# Patient Record
Sex: Male | Born: 1980 | Race: Black or African American | Hispanic: No | Marital: Married | Smoking: Never smoker
Health system: Southern US, Community
[De-identification: ages and names within clinical notes are randomized; demographics above are authoritative.]

## PROBLEM LIST (undated history)

## (undated) DIAGNOSIS — R002 Palpitations: Secondary | ICD-10-CM

## (undated) DIAGNOSIS — K219 Gastro-esophageal reflux disease without esophagitis: Secondary | ICD-10-CM

## (undated) DIAGNOSIS — I10 Essential (primary) hypertension: Secondary | ICD-10-CM

## (undated) DIAGNOSIS — J45909 Unspecified asthma, uncomplicated: Secondary | ICD-10-CM

## (undated) DIAGNOSIS — E7800 Pure hypercholesterolemia, unspecified: Secondary | ICD-10-CM

## (undated) DIAGNOSIS — J302 Other seasonal allergic rhinitis: Secondary | ICD-10-CM

## (undated) HISTORY — DX: Pure hypercholesterolemia, unspecified: E78.00

## (undated) HISTORY — DX: Gastro-esophageal reflux disease without esophagitis: K21.9

## (undated) HISTORY — PX: FOOT SURGERY: SHX648

## (undated) HISTORY — DX: Palpitations: R00.2

## (undated) HISTORY — DX: Unspecified asthma, uncomplicated: J45.909

## (undated) HISTORY — DX: Other seasonal allergic rhinitis: J30.2

---

## 2008-04-19 ENCOUNTER — Emergency Department (HOSPITAL_COMMUNITY): Admission: EM | Admit: 2008-04-19 | Discharge: 2008-04-19 | Payer: Self-pay | Admitting: Emergency Medicine

## 2008-04-21 ENCOUNTER — Emergency Department (HOSPITAL_COMMUNITY): Admission: EM | Admit: 2008-04-21 | Discharge: 2008-04-21 | Payer: Self-pay | Admitting: Emergency Medicine

## 2008-04-24 ENCOUNTER — Emergency Department (HOSPITAL_COMMUNITY): Admission: EM | Admit: 2008-04-24 | Discharge: 2008-04-25 | Payer: Self-pay | Admitting: Emergency Medicine

## 2010-12-21 ENCOUNTER — Emergency Department (HOSPITAL_COMMUNITY)
Admission: EM | Admit: 2010-12-21 | Discharge: 2010-12-21 | Disposition: A | Discharge status: Home / Self Care | Payer: Self-pay | Attending: Emergency Medicine | Admitting: Emergency Medicine

## 2010-12-21 ENCOUNTER — Emergency Department (HOSPITAL_COMMUNITY): Payer: Self-pay

## 2010-12-21 DIAGNOSIS — I10 Essential (primary) hypertension: Secondary | ICD-10-CM | POA: Insufficient documentation

## 2010-12-21 DIAGNOSIS — M940 Chondrocostal junction syndrome [Tietze]: Secondary | ICD-10-CM | POA: Insufficient documentation

## 2010-12-21 DIAGNOSIS — R071 Chest pain on breathing: Secondary | ICD-10-CM | POA: Insufficient documentation

## 2010-12-21 LAB — POCT I-STAT, CHEM 8
Calcium, Ion: 1.17 mmol/L (ref 1.12–1.32)
Creatinine, Ser: 1.3 mg/dL (ref 0.4–1.5)
Glucose, Bld: 74 mg/dL (ref 70–99)
HCT: 45 % (ref 39.0–52.0)
Hemoglobin: 15.3 g/dL (ref 13.0–17.0)

## 2010-12-21 LAB — POCT CARDIAC MARKERS: CKMB, poc: <1 ng/mL — ABNORMAL LOW (ref 1.0–8.0)

## 2010-12-21 IMAGING — CR DG CHEST 1V
1 series · 1 of 1 positions shown · non-contrast
Comparison: None

CLINICAL DATA: Chest pain.

CHEST - 1 VIEW

[w chest pa]
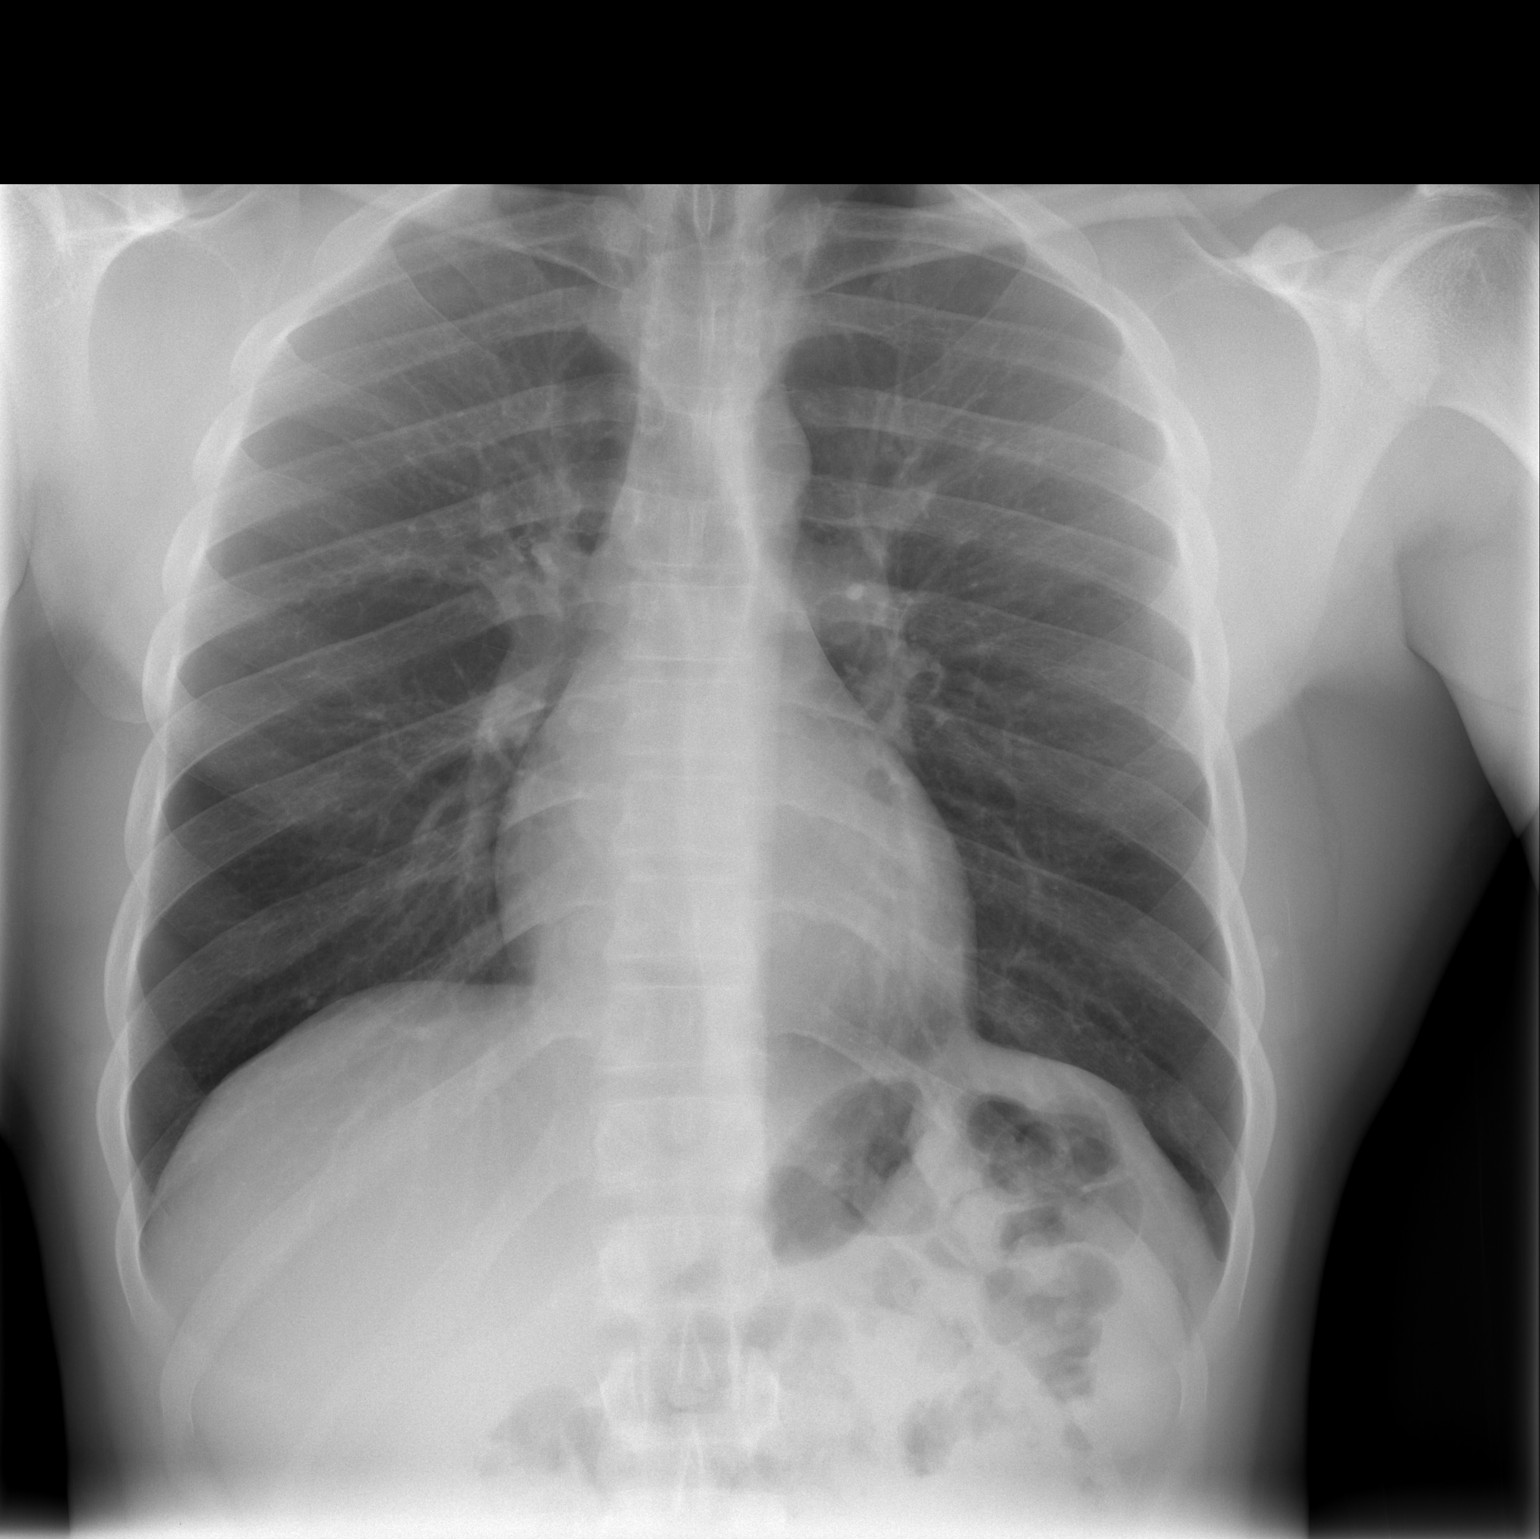

[1 of 1 positions shown; findings below may reference images not displayed]

FINDINGS: Heart and mediastinal contours are within normal limits.
No focal opacities or effusions.  No acute bony abnormality.
IMPRESSION: No active disease.

## 2011-06-27 LAB — WOUND CULTURE

## 2011-08-14 ENCOUNTER — Emergency Department (HOSPITAL_COMMUNITY)
Admission: EM | Admit: 2011-08-14 | Discharge: 2011-08-15 | Disposition: A | Discharge status: Home / Self Care | Payer: Self-pay | Attending: Emergency Medicine | Admitting: Emergency Medicine

## 2011-08-14 ENCOUNTER — Encounter: Payer: Self-pay | Admitting: *Deleted

## 2011-08-14 DIAGNOSIS — I10 Essential (primary) hypertension: Secondary | ICD-10-CM | POA: Insufficient documentation

## 2011-08-14 DIAGNOSIS — H539 Unspecified visual disturbance: Secondary | ICD-10-CM | POA: Insufficient documentation

## 2011-08-14 DIAGNOSIS — R51 Headache: Secondary | ICD-10-CM | POA: Insufficient documentation

## 2011-08-14 DIAGNOSIS — R079 Chest pain, unspecified: Secondary | ICD-10-CM | POA: Insufficient documentation

## 2011-08-14 HISTORY — DX: Essential (primary) hypertension: I10

## 2011-08-14 NOTE — ED Notes (Signed)
Pt reports he lost his job and has not been taking his blood pressure med (Norvasc 5mg ) since last March. States he had blurry vision and a h/a, took his Bp this evening and found it to be 177/93. Pt came to the ER for further evaluation.

## 2011-08-14 NOTE — ED Notes (Signed)
Pt states his bp has been high and he has a headache. Pt also c/o blurred vision.

## 2011-08-15 ENCOUNTER — Other Ambulatory Visit: Payer: Self-pay

## 2011-08-15 LAB — POCT I-STAT, CHEM 8
Creatinine, Ser: 1.2 mg/dL (ref 0.50–1.35)
Glucose, Bld: 88 mg/dL (ref 70–99)
HCT: 49 % (ref 39.0–52.0)
Hemoglobin: 16.7 g/dL (ref 13.0–17.0)
Potassium: 4.2 mEq/L (ref 3.5–5.1)
Sodium: 141 mEq/L (ref 135–145)
TCO2: 29 mmol/L (ref 0–100)

## 2011-08-15 LAB — POCT I-STAT TROPONIN I

## 2011-08-15 MED ORDER — HYDROCHLOROTHIAZIDE 25 MG PO TABS: 25.0000 mg | ORAL_TABLET | Freq: Every day | ORAL | Status: DC

## 2011-08-15 MED ORDER — AMLODIPINE BESYLATE 2.5 MG PO TABS: 10.0000 mg | ORAL_TABLET | Freq: Every day | ORAL | Status: DC

## 2011-08-15 MED ORDER — HYDROCHLOROTHIAZIDE 25 MG PO TABS
25.0000 mg | ORAL_TABLET | Freq: Every day | ORAL | Status: DC
Start: 2011-08-15 — End: 2013-03-30

## 2011-08-15 NOTE — ED Provider Notes (Signed)
Medical screening examination/treatment/procedure(s) were performed by non-physician practitioner and as supervising physician I was immediately available for consultation/collaboration.   Gerhard Munch, MD 08/15/11 1047

## 2011-08-15 NOTE — ED Provider Notes (Signed)
History     CSN: [id number] Arrival date & time: 08/14/2011 10:56 PM   First MD Initiated Contact with Patient 08/15/11 0015      Chief Complaint  Patient presents with  . Headache  . Hypertension    (Consider location/radiation/quality/duration/timing/severity/associated sxs/prior treatment) Patient is a 30 y.o. male presenting with headaches and hypertension. The history is provided by the patient.  Headache  This is a recurrent problem. The problem has not changed since onset.Associated with: He has had similar chest pain and headache with elevated blood pressure. The quality of the pain is described as dull. Pertinent negatives include no fever, no nausea and no vomiting.  Hypertension Associated symptoms include chest pain and headaches. Pertinent negatives include no chills, fever, nausea or vomiting.    Past Medical History  Diagnosis Date  . Hypertension     History reviewed. No pertinent past surgical history.  History reviewed. No pertinent family history.  History  Substance Use Topics  . Smoking status: Not on file  . Smokeless tobacco: Not on file  . Alcohol Use:       Review of Systems  Constitutional: Negative for fever and chills.  Eyes: Positive for visual disturbance.       He had complaint of blurred vision and floaters. Vision improved now.  Respiratory: Negative.   Cardiovascular: Positive for chest pain.  Gastrointestinal: Negative.  Negative for nausea and vomiting.  Musculoskeletal: Negative.   Skin: Negative.   Neurological: Positive for headaches.    Allergies  Review of patient's allergies indicates no known allergies.  Home Medications   Current Outpatient Rx  Name Route Sig Dispense Refill  . AMLODIPINE BESYLATE 5 MG PO TABS Oral Take 5 mg by mouth daily. Has been taking the past four days.    . ASPIRIN EC 81 MG PO TBEC Oral Take 81 mg by mouth daily.        BP 177/93  Pulse 87  Temp(Src) 98.8 F (37.1 C) (Oral)  Resp 20   SpO2 99%  Physical Exam  Constitutional: He appears well-developed and well-nourished.  HENT:  Head: Normocephalic.  Eyes: Pupils are equal, round, and reactive to light.  Fundoscopic exam:      The right eye shows no papilledema.       The left eye shows no papilledema.  Neck: Normal range of motion. Neck supple.  Cardiovascular: Normal rate and regular rhythm.   Pulmonary/Chest: Effort normal and breath sounds normal. He exhibits no tenderness.  Abdominal: Soft. Bowel sounds are normal. There is no tenderness. There is no rebound and no guarding.  Musculoskeletal: Normal range of motion.  Neurological: He is alert. No cranial nerve deficit.  Skin: Skin is warm and dry. No rash noted.  Psychiatric: He has a normal mood and affect.    ED Course  Procedures (including critical care time)  Labs Reviewed - No data to display No results found.   No diagnosis found.    MDM  Patient care turned over to Tama Gander, NP. Waiting on lab studies.  Date: 08/15/2011  Rate: 72  Rhythm: normal sinus rhythm  QRS Axis: normal  Intervals: normal  ST/T Wave abnormalities: normal  Conduction Disutrbances:none  Narrative Interpretation:   Old EKG Reviewed: none available         Rodena Medin, PA 08/15/11 0115  Rodena Medin, PA 08/15/11 0131

## 2013-03-30 ENCOUNTER — Ambulatory Visit (INDEPENDENT_AMBULATORY_CARE_PROVIDER_SITE_OTHER): Payer: Private Health Insurance - Indemnity | Admitting: Podiatry

## 2013-03-30 ENCOUNTER — Encounter: Payer: Self-pay | Admitting: Podiatry

## 2013-03-30 DIAGNOSIS — M722 Plantar fascial fibromatosis: Secondary | ICD-10-CM

## 2013-03-30 DIAGNOSIS — M21969 Unspecified acquired deformity of unspecified lower leg: Secondary | ICD-10-CM

## 2013-03-30 NOTE — Progress Notes (Signed)
Subjective: 32 year old male presents complaining of Left heel pain x 1 year. He works at desk, on feet x 2 hours/shift. Wears Tennis shoes or dress shoes at work. He was having problem with running. The foot hurt after running and had to quit the running exercise. He wishes to be able to go back running but the foot hurt too much.   Objective: Elevated first ray, L>R. Pain and palpable mass left plantar heel left. Neurovascular status are within normal. No open lesions, no discoloration, no edema or erythema noted in lower limbs. Radiographic examination revealed elevated first ray L>R. No acute changes noted.  Assessment: Hypermobile first ray with elevated first Metatarsal L>R. Plantar fasciitis left.  Plan: Reviewed clinical findings and available treatment options. Explained the need for Orthotics and Lapidus left.

## 2013-04-13 ENCOUNTER — Ambulatory Visit (INDEPENDENT_AMBULATORY_CARE_PROVIDER_SITE_OTHER): Payer: Private Health Insurance - Indemnity | Admitting: Podiatry

## 2013-04-13 ENCOUNTER — Encounter: Payer: Self-pay | Admitting: Podiatry

## 2013-04-13 VITALS — BP 148/91 | HR 90 | Ht 67.5 in | Wt 198.0 lb

## 2013-04-13 DIAGNOSIS — M21962 Unspecified acquired deformity of left lower leg: Secondary | ICD-10-CM

## 2013-04-13 DIAGNOSIS — M722 Plantar fascial fibromatosis: Secondary | ICD-10-CM

## 2013-04-13 DIAGNOSIS — M21969 Unspecified acquired deformity of unspecified lower leg: Secondary | ICD-10-CM

## 2013-04-13 NOTE — Patient Instructions (Addendum)
Seen for left foot pain. Reviewed surgery consent form for fusion of the first Metatarsocuneiform joint to stabilize the medial column of foot left.  Surgery will be done under IV sedation at out patient surgery center. Following the surgery, the foot and leg will be placed in a cast that can be ambulatory for 6 weeks. And will continue to recover walking in a CAM walker.  Orthotics will be also utilized after the surgery. Please contact the office with any concerns.

## 2013-04-13 NOTE — Progress Notes (Signed)
Subjective: 32 year old male presents requesting surgical correction of the left foot condition that he had for over a year.   Objective: Pain on left foot arch with weight bearing or with activity.  Forefoot varus when forefoot is loaded while rearfoot is in neutral position.  Clinical examination show elevated first ray with borderline tightness of Achilles tendon bilateral.  Hallux valgus with bunion deformity present bilateral. Neurovascular status are within normal. Previous X-ray indicated severely elevated first Metatarsal in lateral view bilateral. No other acute changes noted.   Assessment: Plantar fasciitis left. Elevated first ray left. Unstable first ray left.  Borderline ankle equinus left.  Plan: Reviewed clinical findings and available treatment options. Patient is to stretch Achilles tendon daily. Patient wants to get Orthotics after the surgery. Reviewed fusion procedure. Fusion of the first Tarsometatarsal joint to plantar flex the first ray and stabilize the medial column.  Consent form reviewed.

## 2013-04-21 DIAGNOSIS — Q66219 Congenital metatarsus primus varus, unspecified foot: Secondary | ICD-10-CM

## 2013-04-21 HISTORY — PX: OTHER SURGICAL HISTORY: SHX169

## 2013-04-26 ENCOUNTER — Encounter: Payer: Self-pay | Admitting: Podiatry

## 2013-04-26 ENCOUNTER — Ambulatory Visit (INDEPENDENT_AMBULATORY_CARE_PROVIDER_SITE_OTHER): Payer: Private Health Insurance - Indemnity | Admitting: Podiatry

## 2013-04-26 VITALS — BP 146/85 | HR 81

## 2013-04-26 DIAGNOSIS — Z9889 Other specified postprocedural states: Secondary | ICD-10-CM

## 2013-04-26 NOTE — Progress Notes (Signed)
Subjective: 4 days post op since Lapidus fusion of the first MCJ left. Doing well with cast. Not hurting much, so he decreased amount of pain medication.  He felt some swelling on toes and has been uncomfortable.  Objective: Cast is intact. All digits are in normal color and able to wiggle.  Plan:  Stated that he would not be able to elevate his leg if he returns to work at this time.  We may need a paper work for him to have short term leave for his work.  Return in 2 weeks to replace the cast. Return if pain or swelling is felt like increasing.

## 2013-04-27 ENCOUNTER — Encounter: Payer: Self-pay | Admitting: Podiatry

## 2013-05-06 ENCOUNTER — Ambulatory Visit (INDEPENDENT_AMBULATORY_CARE_PROVIDER_SITE_OTHER): Payer: Private Health Insurance - Indemnity | Admitting: Podiatry

## 2013-05-06 DIAGNOSIS — Z9889 Other specified postprocedural states: Secondary | ICD-10-CM

## 2013-05-06 NOTE — Progress Notes (Signed)
Patient came in with cast stating he feels the cast is loose. He is walking with crutches in non weight bearing passion.  The cast examined and found to be adequate. The forefoot is firm without excess motion. Upper part of cast is loosed from compressed webb roll. Assessment: Normal cast without abnormal fidings.  Plan: Patient is to keep the cast one more week and return next week on his scheduled time to replace the cast.

## 2013-05-13 ENCOUNTER — Encounter: Payer: Private Health Insurance - Indemnity | Admitting: Podiatry

## 2013-05-13 ENCOUNTER — Ambulatory Visit (INDEPENDENT_AMBULATORY_CARE_PROVIDER_SITE_OTHER): Payer: Private Health Insurance - Indemnity | Admitting: Podiatry

## 2013-05-13 DIAGNOSIS — M21962 Unspecified acquired deformity of left lower leg: Secondary | ICD-10-CM

## 2013-05-13 DIAGNOSIS — M722 Plantar fascial fibromatosis: Secondary | ICD-10-CM

## 2013-05-13 DIAGNOSIS — Z9889 Other specified postprocedural states: Secondary | ICD-10-CM

## 2013-05-13 DIAGNOSIS — M21969 Unspecified acquired deformity of unspecified lower leg: Secondary | ICD-10-CM

## 2013-05-13 NOTE — Progress Notes (Signed)
Subjective: 3 weeks following Lapidus fusion left first MCJ. Came in with left side cast on and walk with crutches. He was not able to put much weight on the surgical side.  Objective: Left lower limb cast is  intact and sound. Cast removed. Mild fore foot edema noted. Surgical wound is well coapted without edema or erythema. No pain at surgery site.  Post-op X-ray left foot done. Findings are consistent with the surgery performed. Internal fixation screws intact. Fusion site bone to bone contact is good without gapping. Shortened first ray length noted. No abnormal findings seen.  Assessment: Normal post op wound progression.  Plan:  New cast left lower limb placed with fiber glass. Continue with semi-weight bearing.  Return in 3 weeks.

## 2013-06-03 ENCOUNTER — Ambulatory Visit (INDEPENDENT_AMBULATORY_CARE_PROVIDER_SITE_OTHER): Payer: Private Health Insurance - Indemnity | Admitting: Podiatry

## 2013-06-03 DIAGNOSIS — M722 Plantar fascial fibromatosis: Secondary | ICD-10-CM

## 2013-06-03 DIAGNOSIS — Z9889 Other specified postprocedural states: Secondary | ICD-10-CM

## 2013-06-03 DIAGNOSIS — M21969 Unspecified acquired deformity of unspecified lower leg: Secondary | ICD-10-CM

## 2013-06-03 NOTE — Progress Notes (Signed)
HPI:  Status post 6 weeks Lapidus fusion left. Stated that he is now able to walk without crutches, but still using crutches for the casted left lower limb.  Cast removed. Surgical site healed well and show no edema or erythema. Left great toe is shorter now since the fusion of the first MCJ.  Plantar flexed and firm first ray noted.  Post op X-ray show internal fixation in good place with close approximation of the fusion site.  Assessment: Consistent finding with the surgery performed.  Plan: Will use CAM walker for 3 weeks and start regular shoes with Orthotics. Both feet were casted for Orthotics. Will see him back when Orthotics are ready (in 3 weeks).

## 2013-07-04 ENCOUNTER — Ambulatory Visit (INDEPENDENT_AMBULATORY_CARE_PROVIDER_SITE_OTHER): Payer: Private Health Insurance - Indemnity | Admitting: Podiatry

## 2013-07-04 ENCOUNTER — Encounter: Payer: Self-pay | Admitting: Podiatry

## 2013-07-04 DIAGNOSIS — Z9889 Other specified postprocedural states: Secondary | ICD-10-CM

## 2013-07-04 NOTE — Patient Instructions (Addendum)
Orthotic dispensed. May try regular shoe gear with Orthotics.  Return in one month for follow up on orthotics.

## 2013-07-04 NOTE — Progress Notes (Signed)
Subjective: 10 week post Lapidus fusion. Able to walk with full weight bearing. Patient is able to tell the improvement with surgery.  Assessment: Status post Lapidus fusion left doing well.  Plan: Orthotic dispensed.  Return in one month for follow up.

## 2013-08-01 ENCOUNTER — Encounter: Payer: Self-pay | Admitting: Podiatry

## 2013-08-01 ENCOUNTER — Ambulatory Visit (INDEPENDENT_AMBULATORY_CARE_PROVIDER_SITE_OTHER): Payer: Private Health Insurance - Indemnity | Admitting: Podiatry

## 2013-08-01 DIAGNOSIS — Z9889 Other specified postprocedural states: Secondary | ICD-10-CM

## 2013-08-01 NOTE — Patient Instructions (Signed)
Seen for follow up on surgery and orthotics. Tenderness at the first big joint is normal and expected following this type of surgery. Continue to increase strength on the tender area. Return as needed.

## 2013-08-01 NOTE — Progress Notes (Signed)
Orthotic check up. Status post Cotton osteotomy with graft. Patient noted of tenderness at the first MPJ with ambulation. Orthotics are helping. Assessment: Satisfactory progress and expected tenderness at the first MPJ following Cotton osteotomy with graft.  Continue to increase strength at the first MPJ. Return as needed.

## 2013-09-09 ENCOUNTER — Encounter: Payer: Self-pay | Admitting: Podiatry

## 2013-09-09 ENCOUNTER — Ambulatory Visit (INDEPENDENT_AMBULATORY_CARE_PROVIDER_SITE_OTHER): Payer: Private Health Insurance - Indemnity | Admitting: Podiatry

## 2013-09-09 VITALS — BP 151/93 | HR 84 | Ht 67.75 in | Wt 203.0 lb

## 2013-09-09 DIAGNOSIS — M79672 Pain in left foot: Secondary | ICD-10-CM | POA: Insufficient documentation

## 2013-09-09 DIAGNOSIS — M79609 Pain in unspecified limb: Secondary | ICD-10-CM

## 2013-09-09 DIAGNOSIS — T84498A Other mechanical complication of other internal orthopedic devices, implants and grafts, initial encounter: Secondary | ICD-10-CM

## 2013-09-09 DIAGNOSIS — T849XXA Unspecified complication of internal orthopedic prosthetic device, implant and graft, initial encounter: Secondary | ICD-10-CM | POA: Insufficient documentation

## 2013-09-09 NOTE — Progress Notes (Signed)
Patient presents with painful knot on left foot surgery area. X-rays taken and noted of loose screw from previous surgery site (Lapidus fusion with plate and screws) moving outward towards skin. Patient start having pain and swelling a weeks ago and gotten much worse.  Assessment: Loose screw from internal fixation plate and screw system.  Plan: Screw removed under local, 5 ml of 0.5% marcaine with epinepherine.  Made a stab incision over medial aspect of the fusion site after prep the skin with Iodine solution. Sterile drapes used. Removed screw dispensed to patient as per request. Care instruction given.

## 2013-09-09 NOTE — Patient Instructions (Signed)
Removed loose screw from left foot, previous surgical site under local. Keep the area clean and dry. May remove suture in one week. Return in one week for suture removal.

## 2014-11-14 DIAGNOSIS — I10 Essential (primary) hypertension: Secondary | ICD-10-CM | POA: Insufficient documentation

## 2016-02-10 ENCOUNTER — Encounter (HOSPITAL_BASED_OUTPATIENT_CLINIC_OR_DEPARTMENT_OTHER): Payer: Self-pay

## 2016-02-10 ENCOUNTER — Emergency Department (HOSPITAL_BASED_OUTPATIENT_CLINIC_OR_DEPARTMENT_OTHER): Payer: BLUE CROSS/BLUE SHIELD

## 2016-02-10 ENCOUNTER — Emergency Department (HOSPITAL_BASED_OUTPATIENT_CLINIC_OR_DEPARTMENT_OTHER)
Admission: EM | Admit: 2016-02-10 | Discharge: 2016-02-10 | Disposition: A | Discharge status: Home / Self Care | Payer: BLUE CROSS/BLUE SHIELD | Attending: Emergency Medicine | Admitting: Emergency Medicine

## 2016-02-10 DIAGNOSIS — Z79899 Other long term (current) drug therapy: Secondary | ICD-10-CM | POA: Diagnosis not present

## 2016-02-10 DIAGNOSIS — I10 Essential (primary) hypertension: Secondary | ICD-10-CM | POA: Diagnosis not present

## 2016-02-10 DIAGNOSIS — R002 Palpitations: Secondary | ICD-10-CM | POA: Insufficient documentation

## 2016-02-10 DIAGNOSIS — R110 Nausea: Secondary | ICD-10-CM | POA: Diagnosis not present

## 2016-02-10 DIAGNOSIS — R109 Unspecified abdominal pain: Secondary | ICD-10-CM | POA: Diagnosis present

## 2016-02-10 DIAGNOSIS — Z7982 Long term (current) use of aspirin: Secondary | ICD-10-CM | POA: Insufficient documentation

## 2016-02-10 LAB — URINALYSIS, ROUTINE W REFLEX MICROSCOPIC
BILIRUBIN URINE: NEGATIVE
GLUCOSE, UA: NEGATIVE mg/dL
HGB URINE DIPSTICK: NEGATIVE
Ketones, ur: NEGATIVE mg/dL
Leukocytes, UA: NEGATIVE
Nitrite: NEGATIVE
Protein, ur: NEGATIVE mg/dL
SPECIFIC GRAVITY, URINE: 1.021 (ref 1.005–1.030)
pH: 6 (ref 5.0–8.0)

## 2016-02-10 LAB — CBC WITH DIFFERENTIAL/PLATELET
Basophils Absolute: 0 10*3/uL (ref 0.0–0.1)
Basophils Relative: 0 %
EOS ABS: 0.1 10*3/uL (ref 0.0–0.7)
EOS PCT: 1 %
HCT: 45 % (ref 39.0–52.0)
Hemoglobin: 15.7 g/dL (ref 13.0–17.0)
LYMPHS ABS: 2.5 10*3/uL (ref 0.7–4.0)
Lymphocytes Relative: 34 %
MCH: 30.6 pg (ref 26.0–34.0)
MCHC: 34.9 g/dL (ref 30.0–36.0)
MCV: 87.7 fL (ref 78.0–100.0)
MONO ABS: 0.9 10*3/uL (ref 0.1–1.0)
MONOS PCT: 12 %
Neutro Abs: 3.8 10*3/uL (ref 1.7–7.7)
Neutrophils Relative %: 53 %
PLATELETS: 200 10*3/uL (ref 150–400)
RBC: 5.13 MIL/uL (ref 4.22–5.81)
RDW: 12 % (ref 11.5–15.5)
WBC: 7.2 10*3/uL (ref 4.0–10.5)

## 2016-02-10 LAB — BASIC METABOLIC PANEL
Anion gap: 8 (ref 5–15)
BUN: 16 mg/dL (ref 6–20)
CO2: 27 mmol/L (ref 22–32)
CREATININE: 1.27 mg/dL — AB (ref 0.61–1.24)
Calcium: 9.6 mg/dL (ref 8.9–10.3)
Chloride: 102 mmol/L (ref 101–111)
GFR calc Af Amer: >60 mL/min (ref >60)
GLUCOSE: 109 mg/dL — AB (ref 65–99)
Potassium: 3.4 mmol/L — ABNORMAL LOW (ref 3.5–5.1)
SODIUM: 137 mmol/L (ref 135–145)

## 2016-02-10 IMAGING — CT CT RENAL STONE PROTOCOL
2 of 4 series · 17 of 46 positions shown, 19 images · non-contrast
Comparison: None.

CLINICAL DATA: Left lateral abdominal pain for several days, now
becoming more sharp and focal.

EXAM:
CT ABDOMEN AND PELVIS WITHOUT CONTRAST
TECHNIQUE: Multidetector CT imaging of the abdomen and pelvis was performed
following the standard protocol without IV contrast.

[Series 2: axial st · axial · 0.83mm/px · z∈[-447,-22]mm · 14 of 95 slices shown, 16 images]
[im 5/95  soft-tissue]
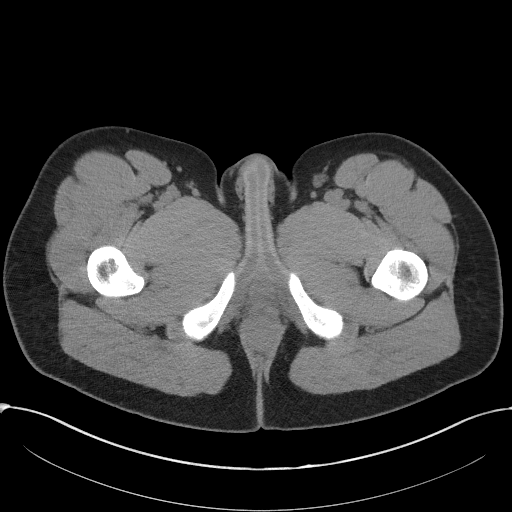
[im 5/95  bone]
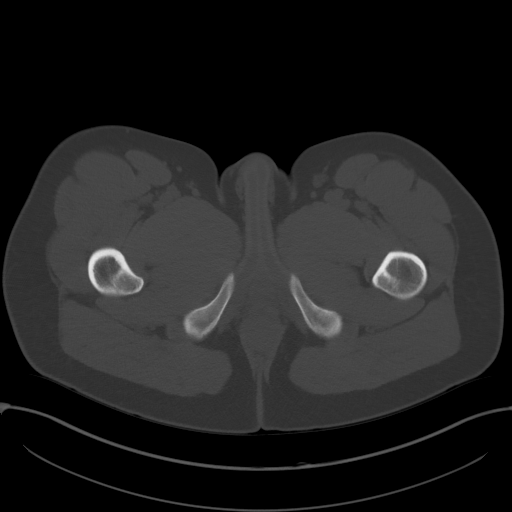
[im 13/95  soft-tissue]
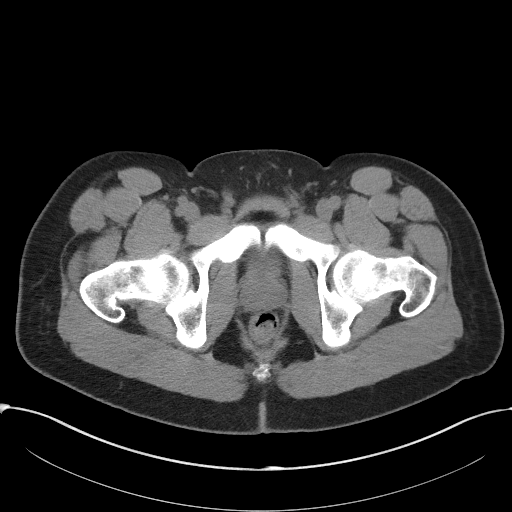
[im 18/95  soft-tissue]
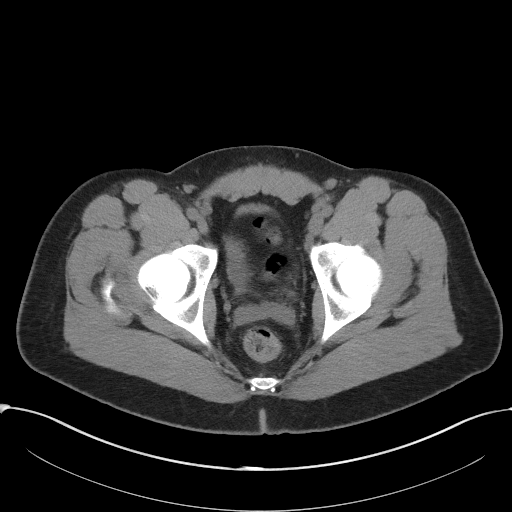
[im 26/95  soft-tissue]
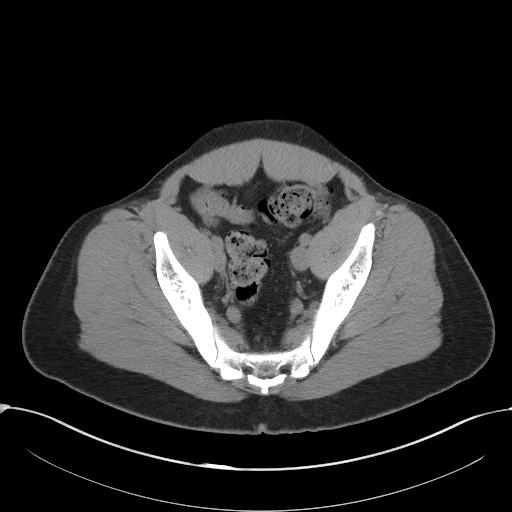
[im 30/95  soft-tissue]
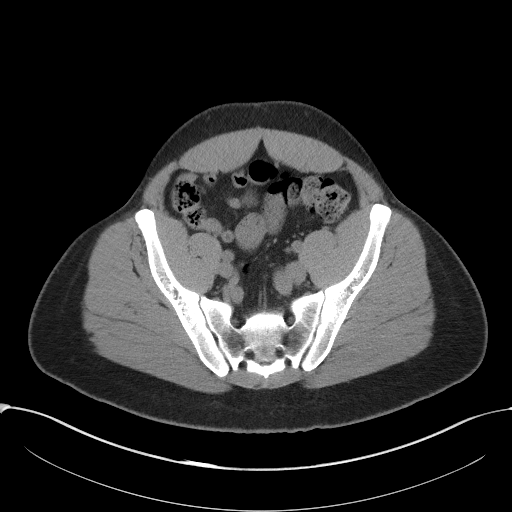
[im 39/95  soft-tissue]
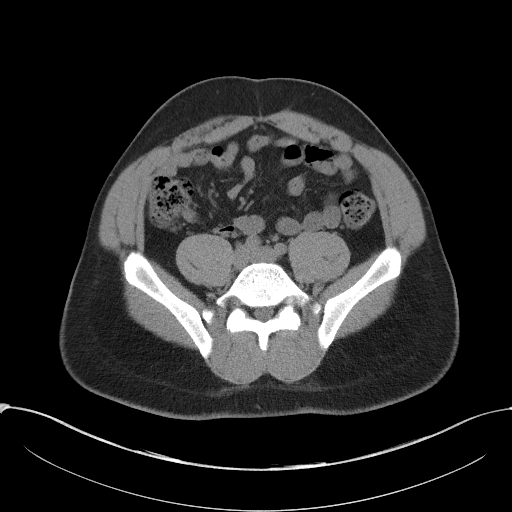
[im 43/95  soft-tissue]
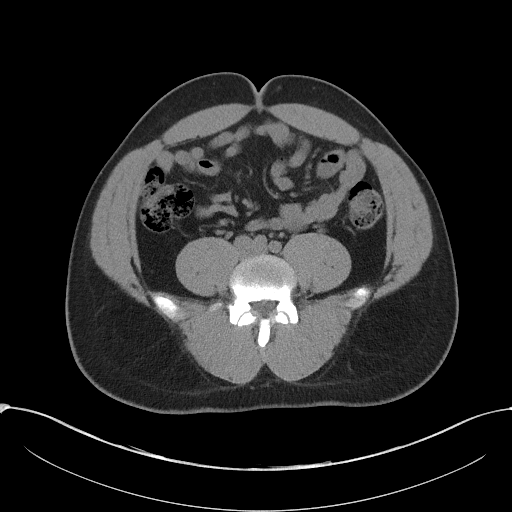
[im 52/95  soft-tissue]
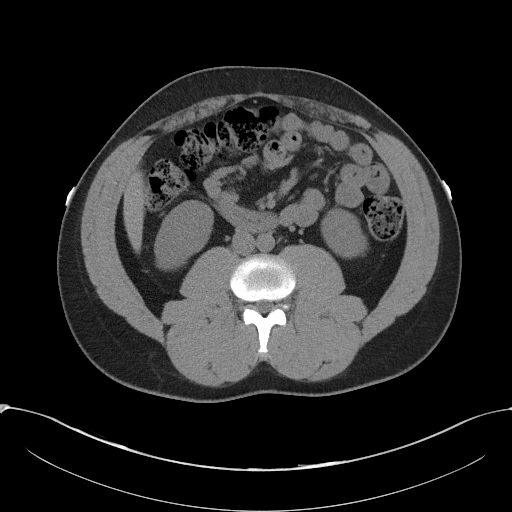
[im 56/95  soft-tissue]
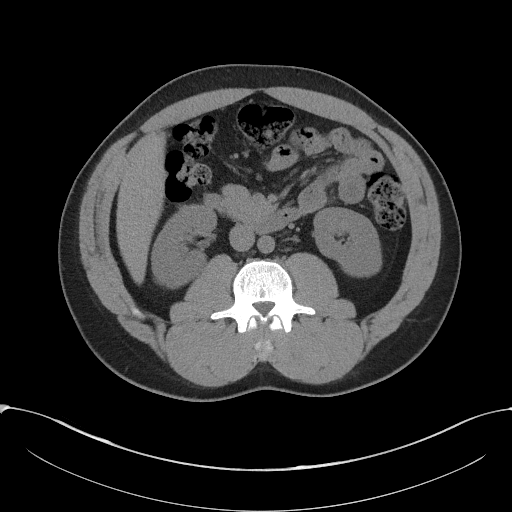
[im 56/95  bone]
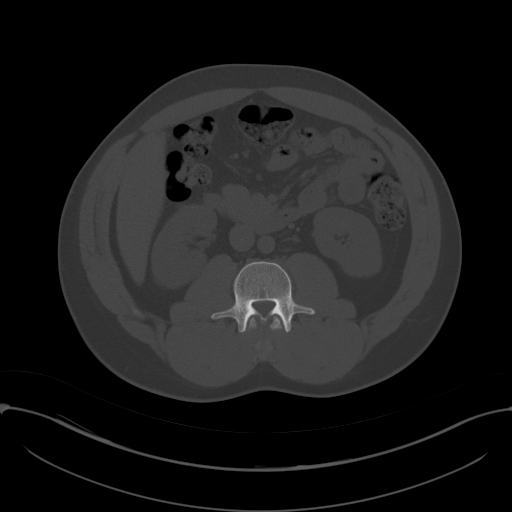
[im 65/95  soft-tissue]
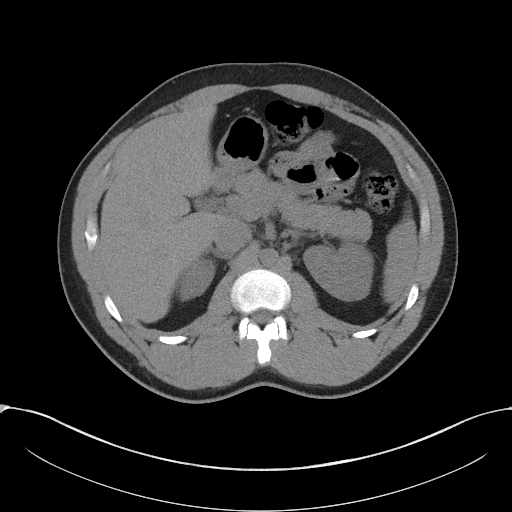
[im 69/95  soft-tissue]
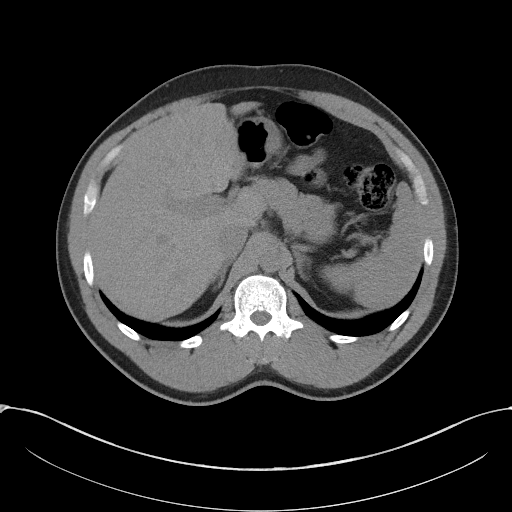
[im 77/95  soft-tissue]
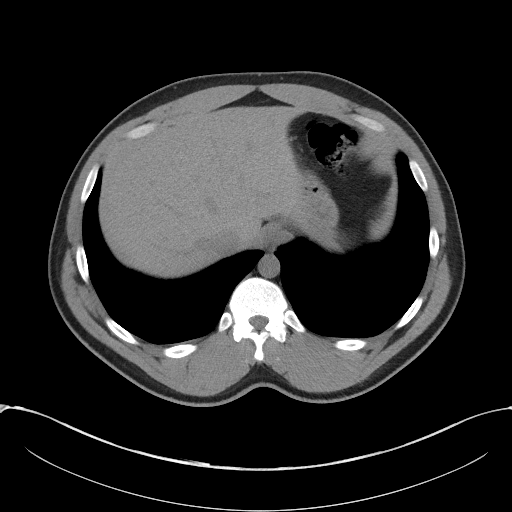
[im 82/95  soft-tissue]
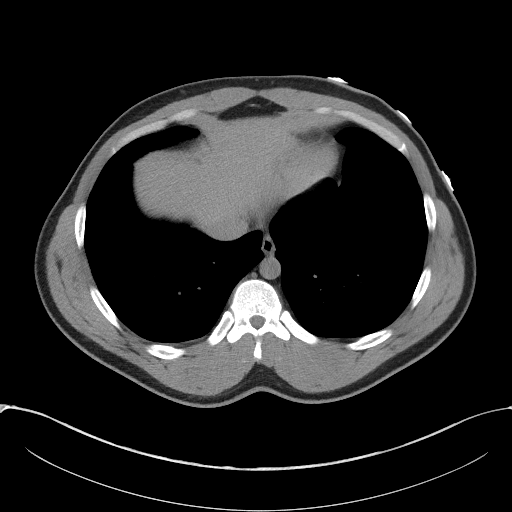
[im 90/95  soft-tissue]
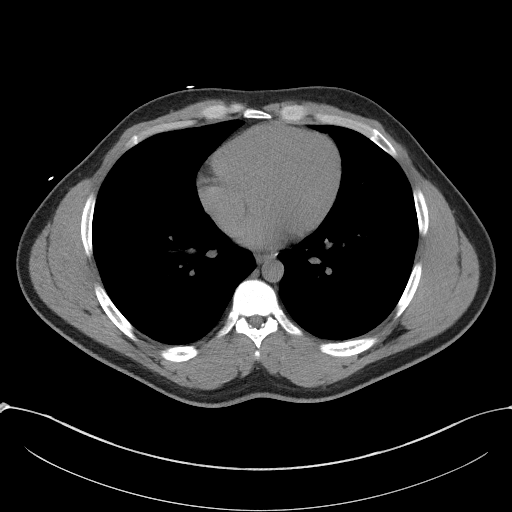

[Series 5: coronal st · coronal · 0.87mm/px · 3 of 83 slices shown]
[im 28/83  soft-tissue]
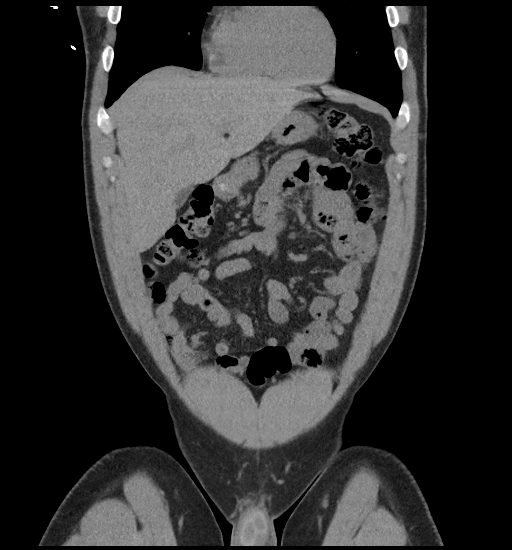
[im 37/83  soft-tissue]
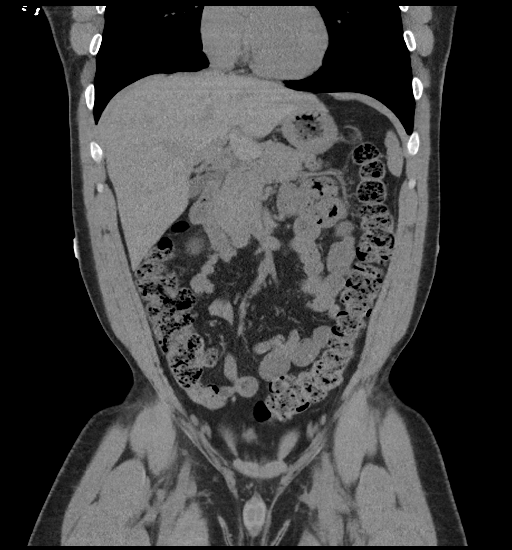
[im 46/83  soft-tissue]
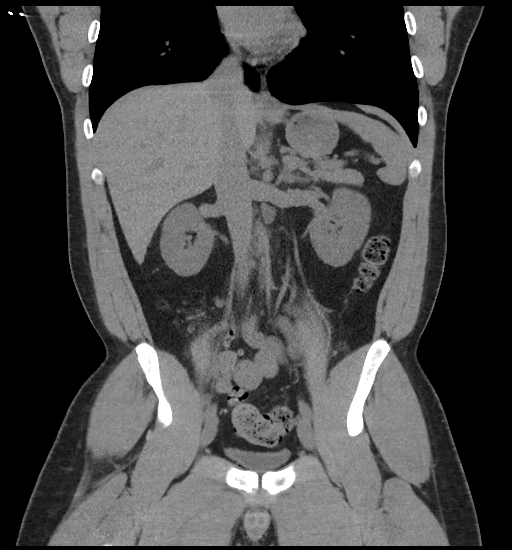

[17 of 46 positions shown; findings below may reference images not displayed]

FINDINGS: There are unremarkable unenhanced appearances of the liver,
gallbladder, bile ducts, pancreas, spleen, adrenals and kidneys.
Ureters and urinary bladder are unremarkable. There are normal
appearances of the stomach, small bowel and colon. The appendix is
normal. The abdominal aorta is normal in caliber. There is no
atherosclerotic calcification. There is no adenopathy in the abdomen
or pelvis.

No acute inflammatory changes are evident in the abdomen or pelvis.
There is no ascites.

There is no significant abnormality in the lower chest.

There is no significant musculoskeletal abnormality.
IMPRESSION: No significant abnormality

## 2016-02-10 MED ORDER — SODIUM CHLORIDE 0.9 % IV BOLUS (SEPSIS)
1000.0000 mL | Freq: Once | INTRAVENOUS | Status: AC
Start: 2016-02-10 — End: 2016-02-10
  Administered 2016-02-10: 1000 mL via INTRAVENOUS

## 2016-02-10 MED ORDER — MORPHINE SULFATE (PF) 4 MG/ML IV SOLN
4.0000 mg | Freq: Once | INTRAVENOUS | Status: AC
  Administered 2016-02-10: 4 mg via INTRAVENOUS
  Filled 2016-02-10: qty 1

## 2016-02-10 MED ORDER — ONDANSETRON HCL 4 MG/2ML IJ SOLN
4.0000 mg | Freq: Once | INTRAMUSCULAR | Status: AC
  Administered 2016-02-10: 4 mg via INTRAVENOUS
  Filled 2016-02-10: qty 2

## 2016-02-10 MED ORDER — OXYCODONE-ACETAMINOPHEN 5-325 MG PO TABS: 1.0000 | ORAL_TABLET | Freq: Four times a day (QID) | ORAL | Status: DC | PRN

## 2016-02-10 NOTE — ED Provider Notes (Signed)
CSN: [id number]     Arrival date & time 02/10/16  0335 History   First MD Initiated Contact with Patient 02/10/16 0400     Chief Complaint  Patient presents with  . Flank Pain     (Consider location/radiation/quality/duration/timing/severity/associated sxs/prior Treatment) HPI  This is a 35 year old male with a history of hypertension who presents with left flank pain. Patient reports 2 to three-day history of left flank pain. He states that it started in his back and now radiates into his left lower quadrant. He had one episode of groin pain. He states that the pain is sharp. Current pain is 7 out of 10. He has not taken anything for the pain. He states that he woke up this morning feeling cold and clammy. He felt like his heart was palpitating. He reports nausea without vomiting. When he stood up to go the restroom he felt weak and laid on the floor. Denies actual leg weakness but reports pain. He was able to stand and walk to the bathroom. He had a bowel movement. This did not change his pain. Bowel movement was normal.  Past Medical History  Diagnosis Date  . Hypertension    Past Surgical History  Procedure Laterality Date  . Lapidus fusion Left 04/21/2013       No family history on file. Social History  Substance Use Topics  . Smoking status: Never Smoker   . Smokeless tobacco: Never Used  . Alcohol Use: No    Review of Systems  Constitutional: Negative for fever.  Respiratory: Negative for shortness of breath.   Cardiovascular: Positive for palpitations. Negative for chest pain.  Gastrointestinal: Positive for nausea. Negative for vomiting, abdominal pain, diarrhea and constipation.  Genitourinary: Positive for flank pain. Negative for dysuria and hematuria.  Neurological: Negative for weakness.  All other systems reviewed and are negative.     Allergies  Review of patient's allergies indicates no known allergies.  Home Medications   Prior to Admission  medications   Medication Sig Start Date End Date Taking? Authorizing Provider  CHLORTHALIDONE PO Take by mouth.   Yes Historical Provider, MD  amLODipine (NORVASC) 5 MG tablet Take 10 mg by mouth daily. Has been taking the past four days.    Historical Provider, MD  aspirin EC 81 MG tablet Take 81 mg by mouth daily.      Historical Provider, MD  hydrochlorothiazide (HYDRODIURIL) 25 MG tablet Take 25 mg by mouth daily. 08/15/11   Roma Kayser Schorr, NP  oxyCODONE-acetaminophen (PERCOCET/ROXICET) 5-325 MG tablet Take 1 tablet by mouth every 6 (six) hours as needed for severe pain. 02/10/16   Shon Baton, MD   BP 142/97 mmHg  Pulse 79  Temp(Src) 98.4 F (36.9 C) (Oral)  Resp 22  Ht  (1.702 m)  Wt 203 lb (92.08 kg)  BMI 31.79 kg/m2  SpO2 98% Physical Exam  Constitutional: He is oriented to person, place, and time. He appears well-developed and well-nourished. No distress.  HENT:  Head: Normocephalic and atraumatic.  Cardiovascular: Normal rate, regular rhythm and normal heart sounds.   No murmur heard. Pulmonary/Chest: Effort normal and breath sounds normal. No respiratory distress. He has no wheezes.  Abdominal: Soft. Bowel sounds are normal. There is no tenderness. There is no rebound and no guarding.  Genitourinary:  No CVA tenderness  Musculoskeletal: He exhibits no edema.  Neurological: He is alert and oriented to person, place, and time.  Normal gait, 5 out of 5 strength in all 4  extremities, normal lower extremity reflexes, no clonus  Skin: Skin is warm and dry.  Psychiatric: He has a normal mood and affect.  Nursing note and vitals reviewed.   ED Course  Procedures (including critical care time) Labs Review Labs Reviewed  BASIC METABOLIC PANEL - Abnormal; Notable for the following:    Potassium 3.4 (*)    Glucose, Bld 109 (*)    Creatinine, Ser 1.27 (*)    All other components within normal limits  URINALYSIS, ROUTINE W REFLEX MICROSCOPIC (NOT AT [facility])  CBC  WITH DIFFERENTIAL/PLATELET    Imaging Review Ct Renal Stone Study  02/10/2016  CLINICAL DATA:  Left lateral abdominal pain for several days, now becoming more sharp and focal. EXAM: CT ABDOMEN AND PELVIS WITHOUT CONTRAST TECHNIQUE: Multidetector CT imaging of the abdomen and pelvis was performed following the standard protocol without IV contrast. COMPARISON:  None. FINDINGS: There are unremarkable unenhanced appearances of the liver, gallbladder, bile ducts, pancreas, spleen, adrenals and kidneys. Ureters and urinary bladder are unremarkable. There are normal appearances of the stomach, small bowel and colon. The appendix is normal. The abdominal aorta is normal in caliber. There is no atherosclerotic calcification. There is no adenopathy in the abdomen or pelvis. No acute inflammatory changes are evident in the abdomen or pelvis. There is no ascites. There is no significant abnormality in the lower chest. There is no significant musculoskeletal abnormality. IMPRESSION: No significant abnormality Electronically Signed   By: Ellery Plunkaniel R Mitchell M.D.   On: 02/10/2016 04:42   I have personally reviewed and evaluated these images and lab results as part of my medical decision-making.   EKG Interpretation   Date/Time:  Sunday Feb 10 2016 04:02:51 EDT Ventricular Rate:  76 PR Interval:  156 QRS Duration: 95 QT Interval:  366 QTC Calculation: 411 R Axis:   43 Text Interpretation:  Sinus rhythm Nonspecific T abnormalities, lateral  leads Early repolarization No significant change since last tracing  Confirmed by HORTON  MD, COURTNEY ([id number]) on 02/10/2016 4:31:49 AM      MDM   Final diagnoses:  Flank pain    Patient presents with left flank pain. Ongoing for the last 2-3 days. Had an episode this morning of palpitations, generalized weakness associated with pain. Nontoxic on exam. Afebrile. No reproducible tenderness on exam. Neurologically intact. Basic labwork obtained and largely reassuring.  Mild hypokalemia. Creatinine at baseline. Urinalysis without evidence of hematuria or leukocytes. CT renal stone study to evaluate for kidney stones was obtained. This is negative. There is no other explanation for his left flank pain. On recheck, he reports improvement with pain medication. He is able to ambulate independently without difficulty. He is tolerating fluids. Discussed with the patient that at this time, the cause of his pain is unknown. He is comfortable and back to baseline. We'll have him follow-up closely with his primary physician. If he has any new or worsening symptoms he should be reevaluated immediately.  After history, exam, and medical workup I feel the patient has been appropriately medically screened and is safe for discharge home. Pertinent diagnoses were discussed with the patient. Patient was given return precautions.     Shon Batonourtney F Horton, MD 02/10/16 [phone]

## 2016-02-10 NOTE — ED Notes (Signed)
Provided patient with water and crackers.

## 2016-02-10 NOTE — Discharge Instructions (Signed)
You were seen today for flank pain. The exact cause of your pain is unknown. All your lab work is reassuring. He'll be sent home with pain medication. If you develop new or worsening symptoms, fever, you should be reevaluated immediately.  Flank Pain Flank pain refers to pain that is located on the side of the body between the upper abdomen and the back. The pain may occur over a short period of time (acute) or may be long-term or reoccurring (chronic). It may be mild or severe. Flank pain can be caused by many things. CAUSES  Some of the more common causes of flank pain include:  Muscle strains.   Muscle spasms.   A disease of your spine (vertebral disk disease).   A lung infection (pneumonia).   Fluid around your lungs (pulmonary edema).   A kidney infection.   Kidney stones.   A very painful skin rash caused by the chickenpox virus (shingles).   Gallbladder disease.  HOME CARE INSTRUCTIONS  Home care will depend on the cause of your pain. In general,  Rest as directed by your caregiver.  Drink enough fluids to keep your urine clear or pale yellow.  Only take over-the-counter or prescription medicines as directed by your caregiver. Some medicines may help relieve the pain.  Tell your caregiver about any changes in your pain.  Follow up with your caregiver as directed. SEEK IMMEDIATE MEDICAL CARE IF:   Your pain is not controlled with medicine.   You have new or worsening symptoms.  Your pain increases.   You have abdominal pain.   You have shortness of breath.   You have persistent nausea or vomiting.   You have swelling in your abdomen.   You feel faint or pass out.   You have blood in your urine.  You have a fever or persistent symptoms for more than 2-3 days.  You have a fever and your symptoms suddenly get worse. MAKE SURE YOU:   Understand these instructions.  Will watch your condition.  Will get help right away if you are not  doing well or get worse.   This information is not intended to replace advice given to you by your health care provider. Make sure you discuss any questions you have with your health care provider.   Document Released: 11/06/2005 Document Revised: 06/09/2012 Document Reviewed: 04/29/2012 Elsevier Interactive Patient Education [employer]2016 [employer]

## 2016-02-10 NOTE — ED Notes (Signed)
Pt reports left flank pain for a few days. Unsure if related but sat down hard a few days ago and had a sharp pain in left groin and left lower abd.  Pt denies urinary symptoms. Reports when he woke up he was cold and clammy and having sob.  Also had nausea.  Reports he tried to walk and couldn't and had to lay down on the floor.

## 2016-09-01 DIAGNOSIS — J302 Other seasonal allergic rhinitis: Secondary | ICD-10-CM | POA: Insufficient documentation

## 2016-09-02 ENCOUNTER — Emergency Department (HOSPITAL_COMMUNITY)
Admission: EM | Admit: 2016-09-02 | Discharge: 2016-09-02 | Disposition: A | Discharge status: Home / Self Care | Payer: BLUE CROSS/BLUE SHIELD | Attending: Emergency Medicine | Admitting: Emergency Medicine

## 2016-09-02 ENCOUNTER — Encounter (HOSPITAL_COMMUNITY): Payer: Self-pay | Admitting: Emergency Medicine

## 2016-09-02 ENCOUNTER — Emergency Department (HOSPITAL_COMMUNITY): Payer: BLUE CROSS/BLUE SHIELD

## 2016-09-02 DIAGNOSIS — R51 Headache: Secondary | ICD-10-CM | POA: Insufficient documentation

## 2016-09-02 DIAGNOSIS — K219 Gastro-esophageal reflux disease without esophagitis: Secondary | ICD-10-CM | POA: Diagnosis not present

## 2016-09-02 DIAGNOSIS — Z7982 Long term (current) use of aspirin: Secondary | ICD-10-CM | POA: Diagnosis not present

## 2016-09-02 DIAGNOSIS — R519 Headache, unspecified: Secondary | ICD-10-CM

## 2016-09-02 DIAGNOSIS — R109 Unspecified abdominal pain: Secondary | ICD-10-CM | POA: Diagnosis present

## 2016-09-02 DIAGNOSIS — I10 Essential (primary) hypertension: Secondary | ICD-10-CM | POA: Insufficient documentation

## 2016-09-02 LAB — CBC
HCT: 47.2 % (ref 39.0–52.0)
Hemoglobin: 16.3 g/dL (ref 13.0–17.0)
MCH: 30 pg (ref 26.0–34.0)
MCHC: 34.5 g/dL (ref 30.0–36.0)
MCV: 86.8 fL (ref 78.0–100.0)
PLATELETS: 201 10*3/uL (ref 150–400)
RBC: 5.44 MIL/uL (ref 4.22–5.81)
RDW: 12.5 % (ref 11.5–15.5)
WBC: 6.5 10*3/uL (ref 4.0–10.5)

## 2016-09-02 LAB — COMPREHENSIVE METABOLIC PANEL
ALT: 26 U/L (ref 17–63)
AST: 21 U/L (ref 15–41)
Albumin: 4.2 g/dL (ref 3.5–5.0)
Alkaline Phosphatase: 86 U/L (ref 38–126)
Anion gap: 5 (ref 5–15)
BUN: 12 mg/dL (ref 6–20)
CALCIUM: 9.6 mg/dL (ref 8.9–10.3)
CHLORIDE: 104 mmol/L (ref 101–111)
CO2: 29 mmol/L (ref 22–32)
Creatinine, Ser: 1.11 mg/dL (ref 0.61–1.24)
Glucose, Bld: 88 mg/dL (ref 65–99)
Potassium: 3.7 mmol/L (ref 3.5–5.1)
Sodium: 138 mmol/L (ref 135–145)
Total Bilirubin: 0.8 mg/dL (ref 0.3–1.2)
Total Protein: 7.9 g/dL (ref 6.5–8.1)

## 2016-09-02 LAB — LIPASE, BLOOD: LIPASE: 30 U/L (ref 11–51)

## 2016-09-02 IMAGING — DX DG CHEST 2V
2 series · 2 of 2 positions shown · non-contrast
Comparison: [DATE]

CLINICAL DATA: Cough and chest pain for 2 weeks.

EXAM:
CHEST  2 VIEW

[w chest pa]
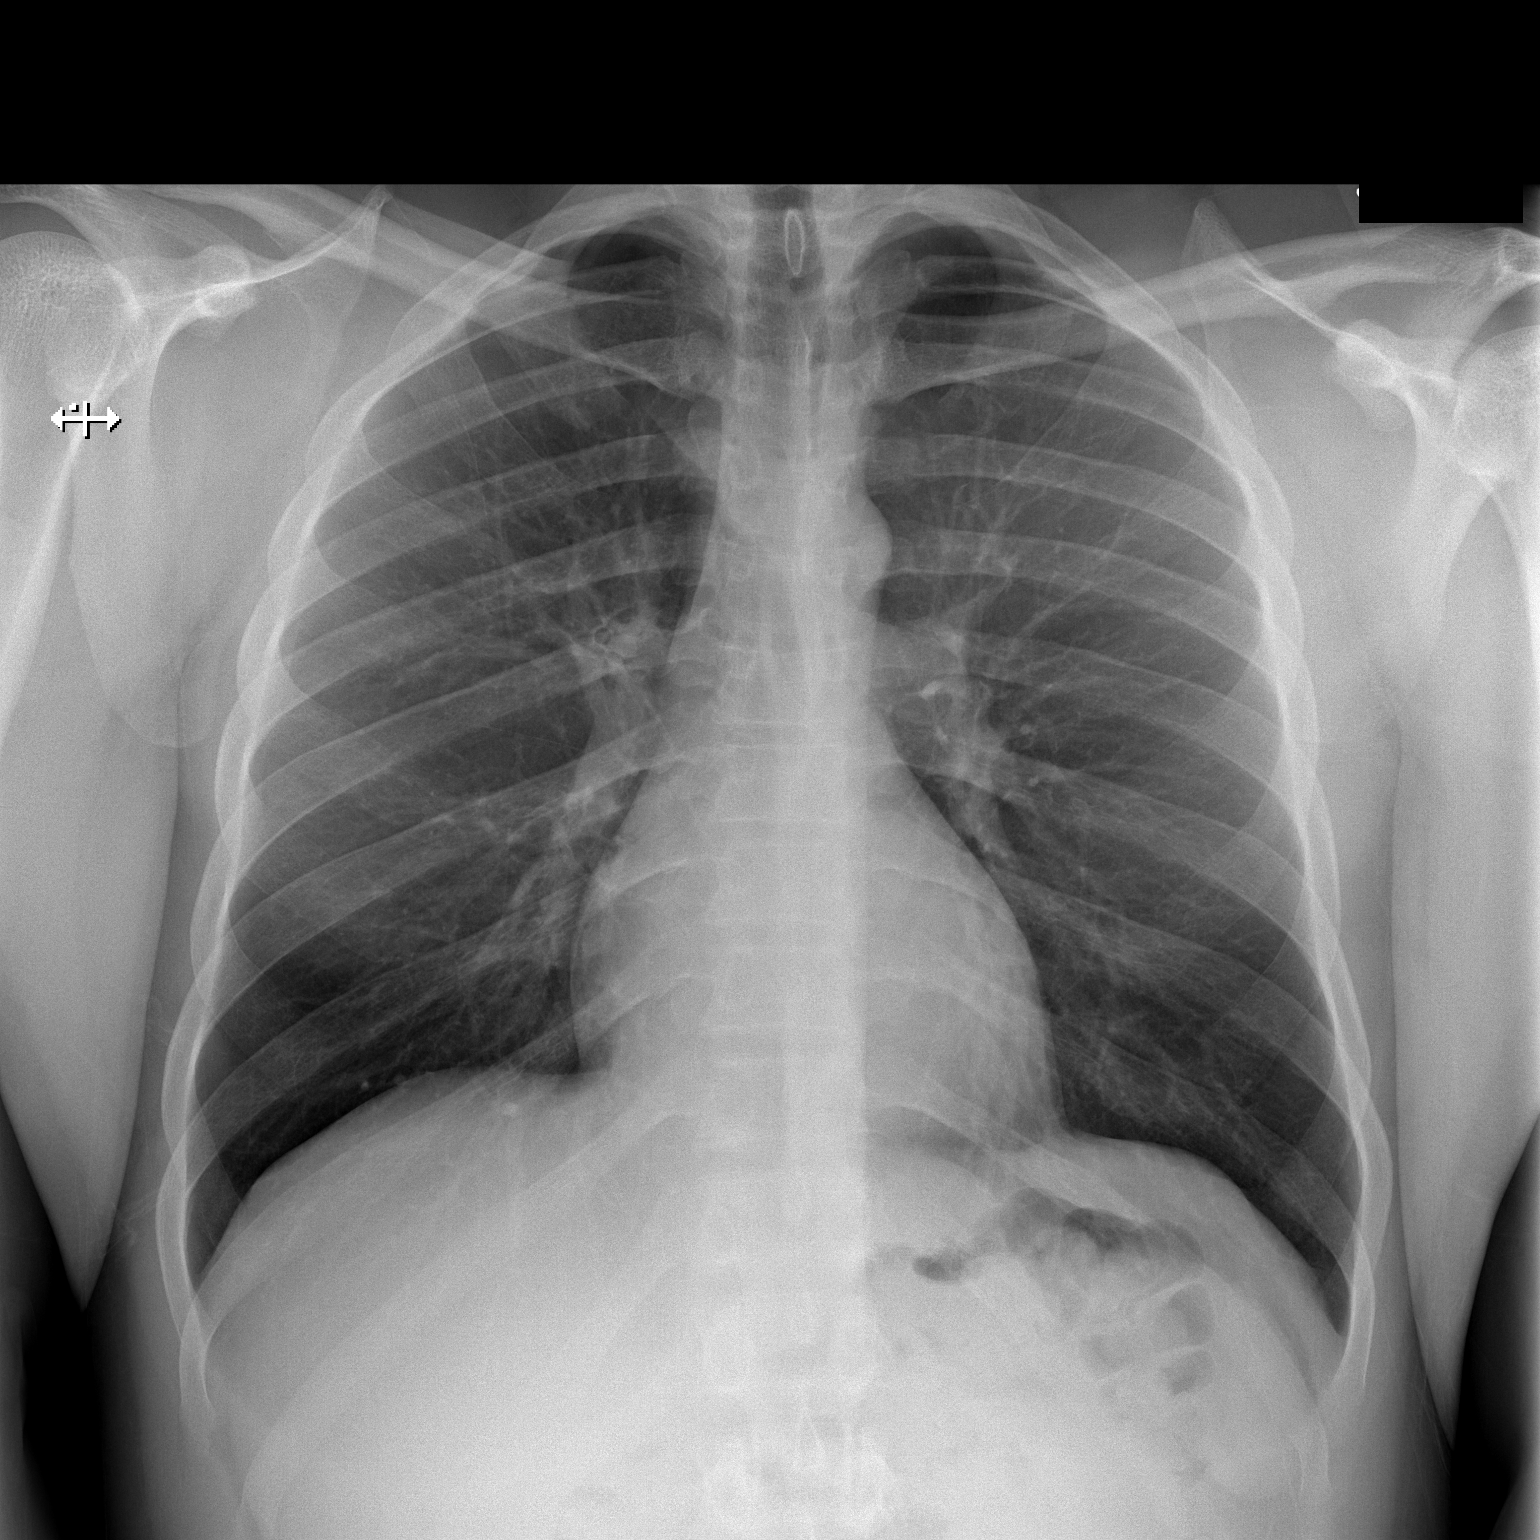

[w chest lat]
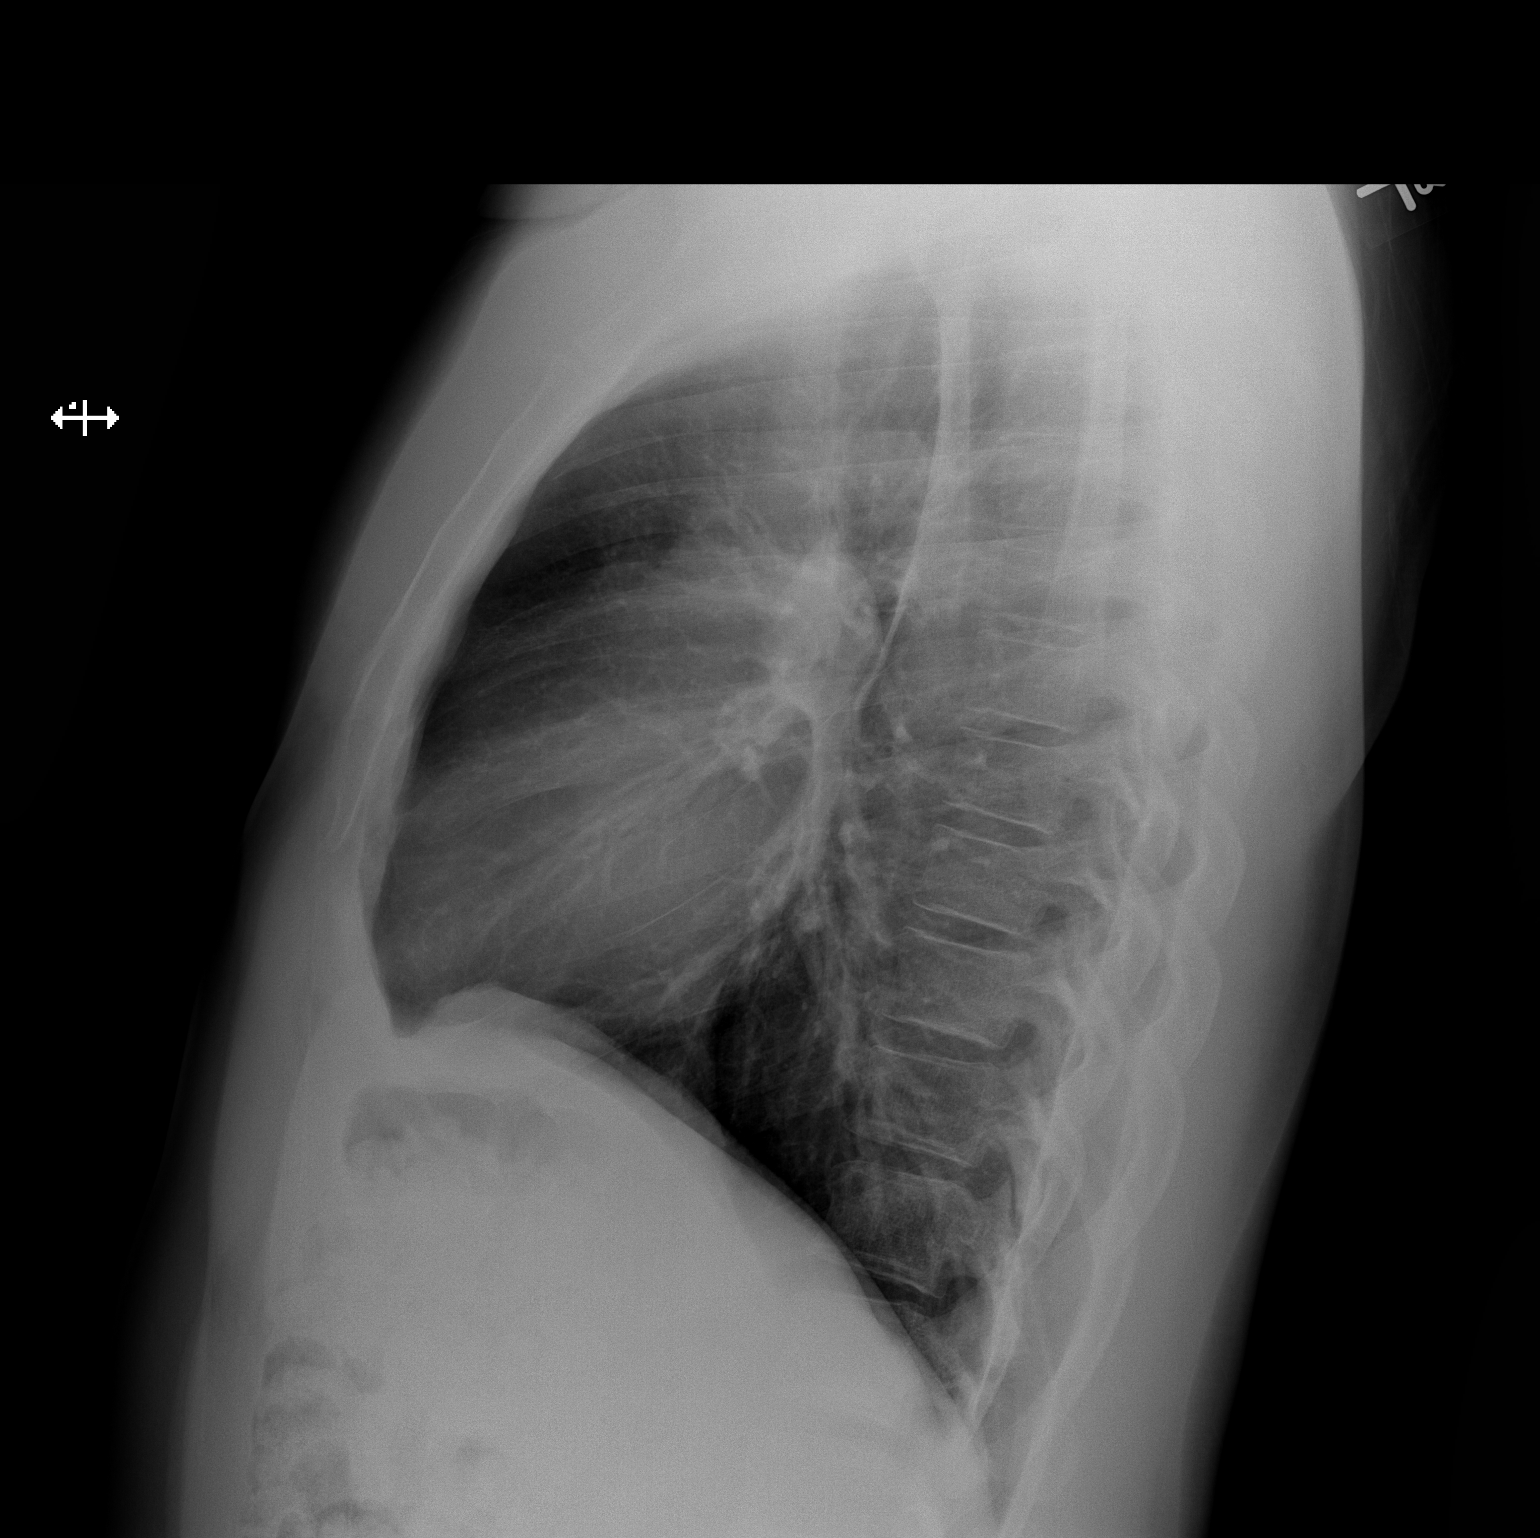

[2 of 2 positions shown; findings below may reference images not displayed]

FINDINGS: The heart size and mediastinal contours are within normal limits.
Both lungs are clear. The visualized skeletal structures are
unremarkable.
IMPRESSION: Negative.  No active cardiopulmonary disease.

## 2016-09-02 MED ORDER — PANTOPRAZOLE SODIUM 40 MG PO TBEC: 40.0000 mg | DELAYED_RELEASE_TABLET | Freq: Two times a day (BID) | ORAL | 0 refills | Status: DC

## 2016-09-02 MED ORDER — PANTOPRAZOLE SODIUM 40 MG PO TBEC
40.0000 mg | DELAYED_RELEASE_TABLET | Freq: Once | ORAL | Status: AC
  Administered 2016-09-02: 40 mg via ORAL
  Filled 2016-09-02: qty 1

## 2016-09-02 MED ORDER — ACETAMINOPHEN 500 MG PO TABS
1000.0000 mg | ORAL_TABLET | Freq: Once | ORAL | Status: AC
  Administered 2016-09-02: 1000 mg via ORAL
  Filled 2016-09-02: qty 2

## 2016-09-02 NOTE — ED Notes (Signed)
Pt is in stable condition upon d/c and ambulates from ED. 

## 2016-09-02 NOTE — ED Notes (Signed)
Patient transported to X-ray 

## 2016-09-02 NOTE — ED Provider Notes (Addendum)
[facility]-EMERGENCY DEPT Provider Note   CSN: [id number] Arrival date & time: 09/02/16  0920     History   Chief Complaint Chief Complaint  Patient presents with  . Nasal Congestion  . Abdominal Pain    HPI Jason Sampson is a 35 y.o. male.  HPI  35 year old male presents for evaluation of headache, hypertension, and abdominal pain. The abdominal pain has been going on for the last 2 or 3 days. He states he's been having cough, nasal congestion, and postnasal drip for about 2 weeks. The first the congestion was yellow, now is clear. No fevers or shortness of breath. Now has abdominal pain that is intermittent but particularly bad at night. It is worse when he lays flat. It feels like he has swallowed glass. Currently does not have the abdominal pain. He went to urgent care yesterday where they told him to try Pepto-Bismol and Zantac. The headache has been right-sided behind his right eye for the past 3 days. Gradually worsening, at its worst yesterday. He states he frequently gets headaches like this when his blood pressure is out of control. BP was 160/110 yesterday. He has been taking his meds as prescribed. Most recent medication adjustment was about a year ago. Denies any chest pain or shortness of breath. No nausea, vomiting, diarrhea, or constipation.  Past Medical History:  Diagnosis Date  . Hypertension     Patient Active Problem List   Diagnosis Date Noted  . Complication of internal fixation device (HCC) 09/09/2013  . Pain in left foot 09/09/2013  . Status post foot surgery 04/26/2013  . Plantar fasciitis of left foot 03/30/2013  . Metatarsal deformity 03/30/2013    Past Surgical History:  Procedure Laterality Date  . Lapidus Fusion Left 04/21/2013   @[facility]        Home Medications    Prior to Admission medications   Medication Sig Start Date End Date Taking? Authorizing Provider  amLODipine (NORVASC) 10 MG tablet Take 10 mg by mouth daily.    Yes Historical  Provider, MD  aspirin EC 81 MG tablet Take 81 mg by mouth daily.     Yes Historical Provider, MD  chlorthalidone (HYGROTON) 25 MG tablet Take 25 mg by mouth daily.   Yes Historical Provider, MD  fluticasone (FLONASE) 50 MCG/ACT nasal spray Place 1 spray into both nostrils daily.   Yes Historical Provider, MD  loratadine (CLARITIN) 10 MG tablet Take 10 mg by mouth daily.   Yes Historical Provider, MD  ranitidine (ZANTAC) 150 MG tablet Take 150 mg by mouth daily.   Yes Historical Provider, MD  pantoprazole (PROTONIX) 40 MG tablet Take 1 tablet (40 mg total) by mouth 2 (two) times daily. 09/02/16   Pricilla LovelessScott Romesha Scherer, MD    Family History History reviewed. No pertinent family history.  Social History Social History  Substance Use Topics  . Smoking status: Never Smoker  . Smokeless tobacco: Never Used  . Alcohol use No     Allergies   Patient has no known allergies.   Review of Systems Review of Systems  Constitutional: Negative for fever.  HENT: Positive for congestion.   Respiratory: Positive for cough. Negative for shortness of breath.   Cardiovascular: Negative for chest pain.  Gastrointestinal: Positive for abdominal pain. Negative for constipation, diarrhea, nausea and vomiting.  Neurological: Positive for headaches. Negative for weakness and numbness.  All other systems reviewed and are negative.    Physical Exam Updated Vital Signs BP (!) 156/102   Pulse 76  Temp 97.9 F (36.6 C) (Oral)   Resp 16   Ht 5\' 7"  (1.702 m)   Wt 206 lb (93.4 kg)   SpO2 99%   BMI 32.26 kg/m   Physical Exam  Constitutional: He is oriented to person, place, and time. He appears well-developed and well-nourished. No distress.  HENT:  Head: Normocephalic and atraumatic.  Right Ear: External ear normal.  Left Ear: External ear normal.  Nose: Nose normal.  Eyes: EOM are normal. Pupils are equal, round, and reactive to light. Right eye exhibits no discharge. Left eye exhibits no discharge.    Neck: Normal range of motion. Neck supple.  No meningismus  Cardiovascular: Normal rate, regular rhythm and normal heart sounds.   Pulmonary/Chest: Effort normal and breath sounds normal.  Abdominal: Soft. He exhibits no distension. There is no tenderness.  Musculoskeletal: He exhibits no edema.  Neurological: He is alert and oriented to person, place, and time.  CN 3-12 grossly intact. 5/5 strength in all 4 extremities. Grossly normal sensation. Normal finger to nose.   Skin: Skin is warm and dry. He is not diaphoretic.  Nursing note and vitals reviewed.    ED Treatments / Results  Labs (all labs ordered are listed, but only abnormal results are displayed) Labs Reviewed  LIPASE, BLOOD  COMPREHENSIVE METABOLIC PANEL  CBC    EKG  EKG Interpretation None       Radiology Dg Chest 2 View  Result Date: 09/02/2016 CLINICAL DATA:  Cough and chest pain for 2 weeks. EXAM: CHEST  2 VIEW COMPARISON:  02/15/2015 FINDINGS: The heart size and mediastinal contours are within normal limits. Both lungs are clear. The visualized skeletal structures are unremarkable. IMPRESSION: Negative.  No active cardiopulmonary disease. Electronically Signed   By: Myles RosenthalJohn  Stahl M.D.   On: 09/02/2016 10:44    Procedures Procedures (including critical care time)  Medications Ordered in ED Medications  pantoprazole (PROTONIX) EC tablet 40 mg (40 mg Oral Given 09/02/16 1030)  acetaminophen (TYLENOL) tablet 1,000 mg (1,000 mg Oral Given 09/02/16 1030)     Initial Impression / Assessment and Plan / ED Course  I have reviewed the triage vital signs and the nursing notes.  Pertinent labs & imaging results that were available during my care of the patient were reviewed by me and considered in my medical decision making (see chart for details).  Clinical Course as of Sep 02 1150  Tue Sep 02, 2016  1011 Overall appears well. Abd exam benign. Normal neuro exam. HA is recurrent, gradual, I have low suspicion  for acute bleeding, infection, etc. Labs, CXR, Tylenol, PPI. Patient will take his morning meds now  [SG]  1149 X-ray and labs are unremarkable. Patient will be discharged home with a PPI and told to follow-up closely with PCP for headache and hypertension. Likely needs outpatient hypertension medication adjustment. Highly doubt subarachnoid hemorrhage, hematoma, meningitis/encephalitis, or clot. Return precautions and follow-up with PCP.  [SG]    Clinical Course User Index [SG] Pricilla LovelessScott Zayvion Stailey, MD   I discussed avoiding NSAIDs and watching out for rectal bleeding or melena. My suspicion for GI bleed/ulcer is low, likely gastritis  Final Clinical Impressions(s) / ED Diagnoses   Final diagnoses:  Right-sided headache  Essential hypertension  Gastroesophageal reflux disease, esophagitis presence not specified    New Prescriptions New Prescriptions   PANTOPRAZOLE (PROTONIX) 40 MG TABLET    Take 1 tablet (40 mg total) by mouth 2 (two) times daily.     Pricilla LovelessScott Nakiesha Rumsey, MD  09/02/16 1151    [name], MD 09/02/16 1152

## 2016-09-02 NOTE — ED Notes (Signed)
Pt is htn rt not taking BP meds this AM.

## 2016-09-02 NOTE — ED Triage Notes (Signed)
Pt states he has had a bad sinus infection with lots of drainage into his throat. Pt has sharp abd pains "feels like I swallowed glass but I didn't." Pt states he cannot sleep the abd pain is so bad. Pt went to urgent care yesterday and was told it was the drainage that has irritated his abd.

## 2017-06-02 ENCOUNTER — Emergency Department (HOSPITAL_BASED_OUTPATIENT_CLINIC_OR_DEPARTMENT_OTHER)
Admission: EM | Admit: 2017-06-02 | Discharge: 2017-06-02 | Disposition: A | Discharge status: Home / Self Care | Payer: BLUE CROSS/BLUE SHIELD | Attending: Emergency Medicine | Admitting: Emergency Medicine

## 2017-06-02 ENCOUNTER — Encounter (HOSPITAL_BASED_OUTPATIENT_CLINIC_OR_DEPARTMENT_OTHER): Payer: Self-pay | Admitting: Emergency Medicine

## 2017-06-02 DIAGNOSIS — Z5321 Procedure and treatment not carried out due to patient leaving prior to being seen by health care provider: Secondary | ICD-10-CM | POA: Diagnosis not present

## 2017-06-02 DIAGNOSIS — H5711 Ocular pain, right eye: Secondary | ICD-10-CM | POA: Diagnosis not present

## 2017-06-02 DIAGNOSIS — R51 Headache: Secondary | ICD-10-CM | POA: Insufficient documentation

## 2017-06-02 DIAGNOSIS — M542 Cervicalgia: Secondary | ICD-10-CM | POA: Diagnosis not present

## 2017-06-02 NOTE — ED Notes (Signed)
Noted pt went out to his car.

## 2017-06-02 NOTE — ED Triage Notes (Signed)
Pt c/o headache since yesterday.  Some neck soreness.  Right eye pain.  Pt has HTN history.

## 2017-06-02 NOTE — ED Notes (Signed)
Pt not in ED WR when called for tx area 

## 2018-01-30 ENCOUNTER — Emergency Department (HOSPITAL_BASED_OUTPATIENT_CLINIC_OR_DEPARTMENT_OTHER)
Admission: EM | Admit: 2018-01-30 | Discharge: 2018-01-30 | Disposition: A | Discharge status: Home / Self Care | Payer: BLUE CROSS/BLUE SHIELD | Attending: Emergency Medicine | Admitting: Emergency Medicine

## 2018-01-30 ENCOUNTER — Encounter (HOSPITAL_BASED_OUTPATIENT_CLINIC_OR_DEPARTMENT_OTHER): Payer: Self-pay | Admitting: Emergency Medicine

## 2018-01-30 ENCOUNTER — Other Ambulatory Visit: Payer: Self-pay

## 2018-01-30 DIAGNOSIS — M542 Cervicalgia: Secondary | ICD-10-CM

## 2018-01-30 DIAGNOSIS — Z79899 Other long term (current) drug therapy: Secondary | ICD-10-CM | POA: Insufficient documentation

## 2018-01-30 DIAGNOSIS — I10 Essential (primary) hypertension: Secondary | ICD-10-CM | POA: Insufficient documentation

## 2018-01-30 DIAGNOSIS — Z7982 Long term (current) use of aspirin: Secondary | ICD-10-CM | POA: Diagnosis not present

## 2018-01-30 MED ORDER — NAPROXEN 250 MG PO TABS
500.0000 mg | ORAL_TABLET | Freq: Once | ORAL | Status: AC
  Administered 2018-01-30: 500 mg via ORAL
  Filled 2018-01-30: qty 2

## 2018-01-30 MED ORDER — NAPROXEN 375 MG PO TABS: ORAL_TABLET | ORAL | 0 refills | Status: DC

## 2018-01-30 NOTE — ED Provider Notes (Signed)
[facility] Provider Note: Jason Dell, MD, FACEP  CSN: [id number] MRN: [id number] ARRIVAL: 01/30/18 at 0543 ROOM: [facility]   CHIEF COMPLAINT  Neck Pain   HISTORY OF PRESENT ILLNESS  01/30/18 5:52 AM Jason Sampson is a 37 y.o. male with about a one-week history of pain in his neck.  The pain is in his posterior lateral neck.  It is sharp in nature and he feels a grating sensation when he moves his neck in certain positions.  When he holds his neck upright the pain is minimal when he leans his head forward or rotates his head to the right he rates his pain is about a 7 out of 10.  It does not radiate to his shoulder or upper extremity.  He is not having any numbness in his left upper extremity.  He denies any trauma to his neck.  He was having headaches earlier in the week but these have improved.   Past Medical History:  Diagnosis Date  . Hypertension     Past Surgical History:  Procedure Laterality Date  . Lapidus Fusion Left 04/21/2013       No family history on file.  Social History   Tobacco Use  . Smoking status: Never Smoker  . Smokeless tobacco: Never Used  Substance Use Topics  . Alcohol use: No  . Drug use: No    Prior to Admission medications   Medication Sig Start Date End Date Taking? Authorizing Provider  amLODipine (NORVASC) 10 MG tablet Take 10 mg by mouth daily.     [provider]  aspirin EC 81 MG tablet Take 81 mg by mouth daily.      [provider]  chlorthalidone (HYGROTON) 25 MG tablet Take 25 mg by mouth daily.    [provider]  fluticasone (FLONASE) 50 MCG/ACT nasal spray Place 1 spray into both nostrils daily.    [provider]  loratadine (CLARITIN) 10 MG tablet Take 10 mg by mouth daily.    [provider]  pantoprazole (PROTONIX) 40 MG tablet Take 1 tablet (40 mg total) by mouth 2 (two) times daily. 09/02/16   [name], MD  ranitidine (ZANTAC) 150 MG tablet Take  150 mg by mouth daily.    [provider]    Allergies Patient has no known allergies.   REVIEW OF SYSTEMS  Negative except as noted here or in the History of Present Illness.   PHYSICAL EXAMINATION  Initial Vital Signs Blood pressure (!) 148/106, pulse 86, temperature 98.7 F (37.1 C), temperature source Oral, resp. rate 18, height  (1.702 m), weight 95.7 kg (211 lb), SpO2 100 %.  Examination General: Well-developed, well-nourished male in no acute distress; appearance consistent with age of record HENT: normocephalic; atraumatic Eyes: pupils equal, round and reactive to light; extraocular muscles intact Neck: supple; rotation of the head to the right or anterior flexion reproduces pain; no tenderness Heart: regular rate and rhythm Lungs: clear to auscultation bilaterally Abdomen: soft; nondistended; nontender; bowel sounds present Extremities: No deformity; full range of motion; pulses normal Neurologic: Awake, alert and oriented; motor function intact in all extremities and symmetric; no facial droop Skin: Warm and dry Psychiatric: Normal mood and affect   RESULTS  Summary of this visit's results, reviewed by myself:   EKG Interpretation  Date/Time:    Ventricular Rate:    PR Interval:    QRS Duration:   QT Interval:    QTC Calculation:   R Axis:  Text Interpretation:        Laboratory Studies: No results found for this or any previous visit (from the past 24 hour(s)). Imaging Studies: No results found.  ED COURSE and MDM  Nursing notes and initial vitals signs, including pulse oximetry, reviewed.  Vitals:   01/30/18 0550  BP: (!) 148/106  Pulse: 86  Resp: 18  Temp: 98.7 F (37.1 C)  TempSrc: Oral  SpO2: 100%  Weight: 95.7 kg (211 lb)  Height:  (1.702 m)   I do not believe plain radiographs would be of benefit at this time.  He was advised that an MRI may be indicated if symptoms persist or worsen.  We will treat with an  anti-inflammatory and refer him to Dr. Pearletha Forge.  PROCEDURES    ED DIAGNOSES     ICD-10-CM   1. Neck pain M54.2        Rajat Staver, Jonny Ruiz, MD 01/30/18 [phone]

## 2018-01-30 NOTE — ED Triage Notes (Signed)
PT presents with neck pain for over a week and headache

## 2018-01-30 NOTE — ED Notes (Signed)
PT discharged to home NAD.  

## 2018-02-04 ENCOUNTER — Encounter: Payer: Self-pay | Admitting: Family Medicine

## 2018-02-04 ENCOUNTER — Ambulatory Visit (INDEPENDENT_AMBULATORY_CARE_PROVIDER_SITE_OTHER): Payer: BLUE CROSS/BLUE SHIELD | Admitting: Family Medicine

## 2018-02-04 VITALS — BP 132/92 | HR 87 | Ht 67.0 in | Wt 211.0 lb

## 2018-02-04 DIAGNOSIS — M542 Cervicalgia: Secondary | ICD-10-CM | POA: Diagnosis not present

## 2018-02-04 MED ORDER — MELOXICAM 15 MG PO TABS: 15.0000 mg | ORAL_TABLET | Freq: Every day | ORAL | 2 refills | Status: DC

## 2018-02-04 NOTE — Progress Notes (Signed)
PCP: Patient, No Pcp Per  Subjective:   HPI: Patient is a 37 y.o. male with medical history significant for HTN here for 2 weeks of left-sided neck pain. He cannot recall any inciting event. He did go to an urgent care for a severe right-sided headache around the same time of neck pain onset. The pain is sharp and located at the superior region of the left trapezius. He rates it as a 6-7/10 in severity. He went to the ED 5 days ago due to its severity and was discharged with an anti-inflammatory. The pain does not radiate. He notices that it bothers him more in the mornings. It is worse with rotation, both to the left and right, and worse with neck flexion. He has tried ice and heat, which have both provided mild relief. He took 1-2 days of Aleve, but did not find that it helped. He notes a numbness in his hand that started about 5 days ago and resolved the next day. He denies any weakness in his extremities. He does report an incident of dizziness that suddenly came on one week ago causing him to lose his balance. It resolved after a few minutes and he has not had these symptoms since. He cracks his neck frequently and is concerned that it may have something to do with his symptoms. He has not had anything like this before. He denies significant previous injury to his neck, though he does recall multiple stingers form when he played football in high school.   ROS: See HPI.  PMH reviewed with patient.   Occupation: Works at [employer] [place].    Past Medical History:  Diagnosis Date  . Hypertension     Current Outpatient Medications on File Prior to Visit  Medication Sig Dispense Refill  . amLODipine (NORVASC) 10 MG tablet Take 10 mg by mouth daily.     Marland Kitchen aspirin EC 81 MG tablet Take 81 mg by mouth daily.      . chlorthalidone (HYGROTON) 25 MG tablet Take 25 mg by mouth daily.    . fluticasone (FLONASE) 50 MCG/ACT nasal spray Place 1 spray into both nostrils daily.    Marland Kitchen loratadine (CLARITIN)  10 MG tablet Take 10 mg by mouth daily.    . naproxen (NAPROSYN) 375 MG tablet Take 1 tablet twice daily with a meal as needed for neck pain. 20 tablet 0  . pantoprazole (PROTONIX) 40 MG tablet Take 1 tablet (40 mg total) by mouth 2 (two) times daily. 14 tablet 0  . ranitidine (ZANTAC) 150 MG tablet Take 150 mg by mouth daily.     No current facility-administered medications on file prior to visit.     Past Surgical History:  Procedure Laterality Date  . Lapidus Fusion Left 04/21/2013       No Known Allergies   No family history on file.  BP (!) 132/92   Pulse 87   Ht  (1.702 m)   Wt 211 lb (95.7 kg)   BMI 33.05 kg/m       Objective:  Physical Exam:  Gen: NAD, comfortable in exam room CV: warm, well-perfused Resp: non-labored breathing Neuro: Normal gait observed. Symmetric 1+ triceps, biceps, and brachioradialis reflexes.  Neck: No deformity. No skin erythema or discoloration. No pain on palpation of spinous processes. Mild pain on palpation of left superior portion of trapezius. Full ROM with flexion/extension, rotation, and lateral flexion. Left sided neck pain with rotation and forward flexion only. 5/5 strength with resisted  rotation; no pain with resisted rotation. NVI distally. Upper extremities: Sensation intact and equal bilaterally. 5/5 strength at hand, wrist, and elbow. Radial pulses 2+.    Assessment & Plan:  1. Left-sided neck pain:  Patient presents with 2 weeks of atraumatic left-sided neck pain. He denies persistent radicular symptoms and has no signs of weakness or altered reflexes on exam. His pain is localized to the left trapezius and is worse with rotation of his neck. Most likely etiologies include muscle spasm or mild underlying arthritis. Further imaging is not indicated at this time given that it is unlikely to affect management. Recommended a course of physical therapy as well as home exercises and stretching. Prescribed meloxicam to take up  to once daily. Will follow-up in 6 weeks for evaluation of improvement of neck pain.

## 2018-02-04 NOTE — Patient Instructions (Signed)
You have a trapezius strain, paraspinal muscle strain. Meloxicam daily with food as needed for pain and inflammation. Can consider prednisone dose pack, muscle relaxant, pain medication. Simple range of motion exercises within limits of pain to prevent further stiffness. Start physical therapy for stretching, exercises, traction, and modalities - do home exercises on days you don't go to therapy. Heat 15 minutes at a time 3-4 times a day to help with spasms. Watch head position when on computers, texting, when sleeping in bed - should in line with back to prevent further spasms, nerve irritation. If not improving we will consider an MRI. Follow up with me in 6 weeks.

## 2018-02-05 ENCOUNTER — Encounter: Payer: Self-pay | Admitting: Family Medicine

## 2018-02-05 DIAGNOSIS — M542 Cervicalgia: Secondary | ICD-10-CM | POA: Insufficient documentation

## 2018-02-05 NOTE — Assessment & Plan Note (Signed)
Patient presents with 2 weeks of atraumatic left-sided neck pain. He denies persistent radicular symptoms and has no signs of weakness or altered reflexes on exam. His pain is localized to the left trapezius and is worse with rotation of his neck. Most likely etiologies include muscle spasm or mild underlying arthritis. Further imaging is not indicated at this time given that it is unlikely to affect management. Recommended a course of physical therapy as well as home exercises and stretching. Prescribed meloxicam to take up to once daily. Will follow-up in 6 weeks for evaluation of improvement of neck pain.

## 2018-03-19 ENCOUNTER — Ambulatory Visit: Payer: BLUE CROSS/BLUE SHIELD | Admitting: Family Medicine

## 2018-03-25 ENCOUNTER — Ambulatory Visit: Payer: BLUE CROSS/BLUE SHIELD | Admitting: Family Medicine

## 2018-08-24 ENCOUNTER — Other Ambulatory Visit: Payer: Self-pay | Admitting: Internal Medicine

## 2018-08-24 ENCOUNTER — Ambulatory Visit (INDEPENDENT_AMBULATORY_CARE_PROVIDER_SITE_OTHER): Payer: BLUE CROSS/BLUE SHIELD

## 2018-08-24 DIAGNOSIS — R002 Palpitations: Secondary | ICD-10-CM

## 2018-10-01 DIAGNOSIS — E7800 Pure hypercholesterolemia, unspecified: Secondary | ICD-10-CM | POA: Diagnosis not present

## 2018-10-01 DIAGNOSIS — Z23 Encounter for immunization: Secondary | ICD-10-CM | POA: Diagnosis not present

## 2018-10-01 DIAGNOSIS — Z0000 Encounter for general adult medical examination without abnormal findings: Secondary | ICD-10-CM | POA: Diagnosis not present

## 2018-11-19 ENCOUNTER — Ambulatory Visit: Payer: BLUE CROSS/BLUE SHIELD | Admitting: Cardiovascular Disease

## 2018-11-19 ENCOUNTER — Encounter: Payer: Self-pay | Admitting: Cardiology

## 2018-12-07 DIAGNOSIS — I493 Ventricular premature depolarization: Secondary | ICD-10-CM | POA: Insufficient documentation

## 2018-12-07 DIAGNOSIS — R002 Palpitations: Secondary | ICD-10-CM | POA: Insufficient documentation

## 2018-12-07 NOTE — Progress Notes (Deleted)
Cardiology Office Note    Date:  12/07/2018   ID:  Jason Sampson, DOB 21-Oct-1980, MRN [id number]  PCP:  Patient, No Pcp Per  Cardiologist:  Jason Magic, MD   No chief complaint on file.   History of Present Illness:  Jason Sampson is a 38 y.o. male who is being seen today for the evaluation of palpitations at the request of Jason Dills, MD.  This is a 38yo male with a history of asthma, GERD, hyperlipidemia (last LDL 102), HTN who was seen by Dr. Nehemiah Sampson 07/2018 with complaints of palpitations.  He wore a Holter monitor which showed NSR with PVCs and PACs.  He has not been drinking much caffeine.  He continues to have palpitations and now is referred for further evaluation.  He is here today for followup and is doing well.  He denies any chest pain or pressure, SOB, DOE, PND, orthopnea, LE edema, dizziness, palpitations or syncope. He is compliant with his meds and is tolerating meds with no SE.    Past Medical History:  Diagnosis Date  . Asthma   . GERD (gastroesophageal reflux disease)   . Hypercholesterolemia    PURE  . Hypertension   . Palpitations   . Seasonal allergies     Past Surgical History:  Procedure Laterality Date  . Lapidus Fusion Left 04/21/2013   @[facility]     Current Medications: No outpatient medications have been marked as taking for the 12/08/18 encounter (Appointment) with Jason Reichert, MD.    Allergies:   Patient has no known allergies.   Social History   Socioeconomic History  . Marital status: Married    Spouse name: Not on file  . Number of children: Not on file  . Years of education: Not on file  . Highest education level: Not on file  Occupational History  . Not on file  Social Needs  . Financial resource strain: Not on file  . Food insecurity:    Worry: Not on file    Inability: Not on file  . Transportation needs:    Medical: Not on file    Non-medical: Not on file  Tobacco Use  . Smoking status: Never Smoker  .  Smokeless tobacco: Never Used  Substance and Sexual Activity  . Alcohol use: No  . Drug use: No  . Sexual activity: Not on file  Lifestyle  . Physical activity:    Days per week: Not on file    Minutes per session: Not on file  . Stress: Not on file  Relationships  . Social connections:    Talks on phone: Not on file    Gets together: Not on file    Attends religious service: Not on file    Active member of club or organization: Not on file    Attends meetings of clubs or organizations: Not on file    Relationship status: Not on file  Other Topics Concern  . Not on file  Social History Narrative  . Not on file     Family History:  The patient's family history includes Atrial fibrillation in his father; CVA in his mother; Hypertension in his father and mother; Hypothyroidism in his sister; Lupus in his mother; Migraines in his sister; Sarcoidosis in his mother.   ROS:   Please see the history of present illness.    ROS All other systems reviewed and are negative.  No flowsheet data found.     PHYSICAL EXAM:   VS:  There  were no vitals taken for this visit.   GEN: Well nourished, well developed, in no acute distress  HEENT: normal  Neck: no JVD, carotid bruits, or masses Cardiac: RRR; no murmurs, rubs, or gallops,no edema.  Intact distal pulses bilaterally.  Respiratory:  clear to auscultation bilaterally, normal work of breathing GI: soft, nontender, nondistended, + BS MS: no deformity or atrophy  Skin: warm and dry, no rash Neuro:  Alert and Oriented x 3, Strength and sensation are intact Psych: euthymic mood, full affect  Wt Readings from Last 3 Encounters:  02/04/18 211 lb (95.7 kg)  01/30/18 211 lb (95.7 kg)  06/02/17 210 lb (95.3 kg)      Studies/Labs Reviewed:   EKG:  EKG is not ordered today.  The ekg from PCP 07/2018 showed NSR with nonspecific T wave abnormality in the lateral precordial leads and inferiorly.   Recent Labs: No results found for  requested labs within last 8760 hours.   Lipid Panel No results found for: CHOL, TRIG, HDL, CHOLHDL, VLDL, LDLCALC, LDLDIRECT  Additional studies/ records that were reviewed today include:  Office notes from PCP   ASSESSMENT:    1. Palpitations   2. PVC's (premature ventricular contractions)   3. Essential hypertension      PLAN:  In order of problems listed above:  1.  Palpitations - holter monitor showed PACs and PVCs.    2.  PVC's - ? Triggered by hypokalemia as K+ was 3.2 in 07/2018.  I will repeat BMET and Mag today.  If K+ low then recommend supplementation as this could trigger PVCs.  I will get an echo to assess LVF and ETT to rule out ischemia.   3.  HTN - BP is controlled on exam today.  He will continue on amlodipine 10mg  daily and chlorthalidone 25mg  daily.     Medication Adjustments/Labs and Tests Ordered: Current medicines are reviewed at length with the patient today.  Concerns regarding medicines are outlined above.  Medication changes, Labs and Tests ordered today are listed in the Patient Instructions below.  There are no Patient Instructions on file for this visit.   Signed, Jason Magic, MD  12/07/2018 8:20 PM    [facility] [address] [place], [place], [place]  [address] Phone: [phone]; Fax: [phone]

## 2018-12-08 ENCOUNTER — Ambulatory Visit: Payer: Self-pay | Admitting: Cardiology

## 2018-12-08 DIAGNOSIS — J45909 Unspecified asthma, uncomplicated: Secondary | ICD-10-CM | POA: Diagnosis not present

## 2018-12-08 DIAGNOSIS — I10 Essential (primary) hypertension: Secondary | ICD-10-CM | POA: Diagnosis not present

## 2018-12-08 DIAGNOSIS — R066 Hiccough: Secondary | ICD-10-CM | POA: Insufficient documentation

## 2018-12-08 DIAGNOSIS — Z7982 Long term (current) use of aspirin: Secondary | ICD-10-CM | POA: Insufficient documentation

## 2018-12-08 DIAGNOSIS — Z79899 Other long term (current) drug therapy: Secondary | ICD-10-CM | POA: Insufficient documentation

## 2018-12-08 NOTE — ED Triage Notes (Signed)
Hiccups for four days

## 2018-12-09 ENCOUNTER — Encounter (HOSPITAL_BASED_OUTPATIENT_CLINIC_OR_DEPARTMENT_OTHER): Payer: Self-pay

## 2018-12-09 ENCOUNTER — Emergency Department (HOSPITAL_BASED_OUTPATIENT_CLINIC_OR_DEPARTMENT_OTHER): Payer: 59

## 2018-12-09 ENCOUNTER — Other Ambulatory Visit: Payer: Self-pay

## 2018-12-09 ENCOUNTER — Emergency Department (HOSPITAL_BASED_OUTPATIENT_CLINIC_OR_DEPARTMENT_OTHER)
Admission: EM | Admit: 2018-12-09 | Discharge: 2018-12-09 | Disposition: A | Discharge status: Home / Self Care | Payer: 59 | Attending: Emergency Medicine | Admitting: Emergency Medicine

## 2018-12-09 DIAGNOSIS — R066 Hiccough: Secondary | ICD-10-CM

## 2018-12-09 IMAGING — DX CHEST - 2 VIEW
2 series · 2 of 2 positions shown · non-contrast
Comparison: [DATE]

CLINICAL DATA: Hiccups for 4 days. Foods and liquids are not going
down.

EXAM:
CHEST - 2 VIEW

[chest pa]
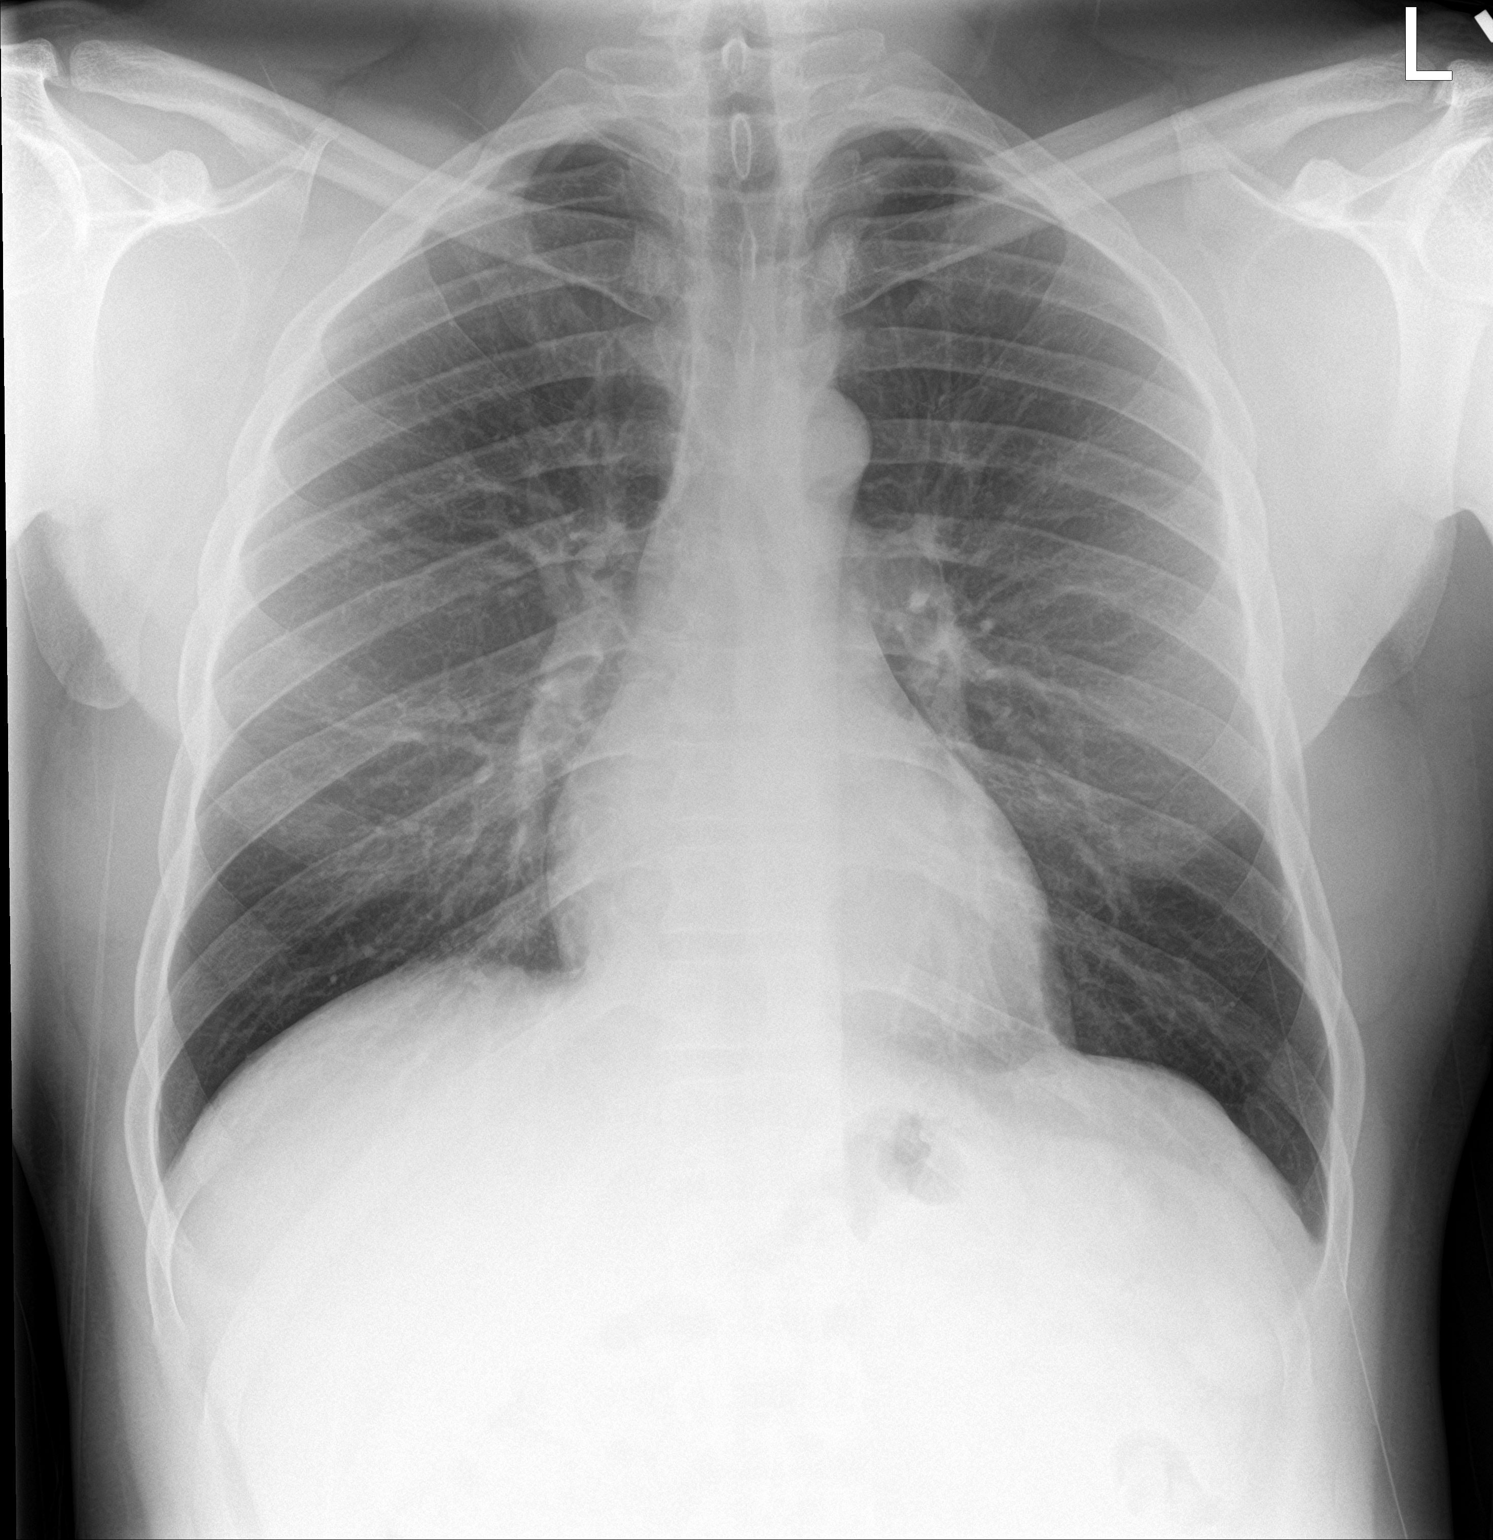

[chest lat]
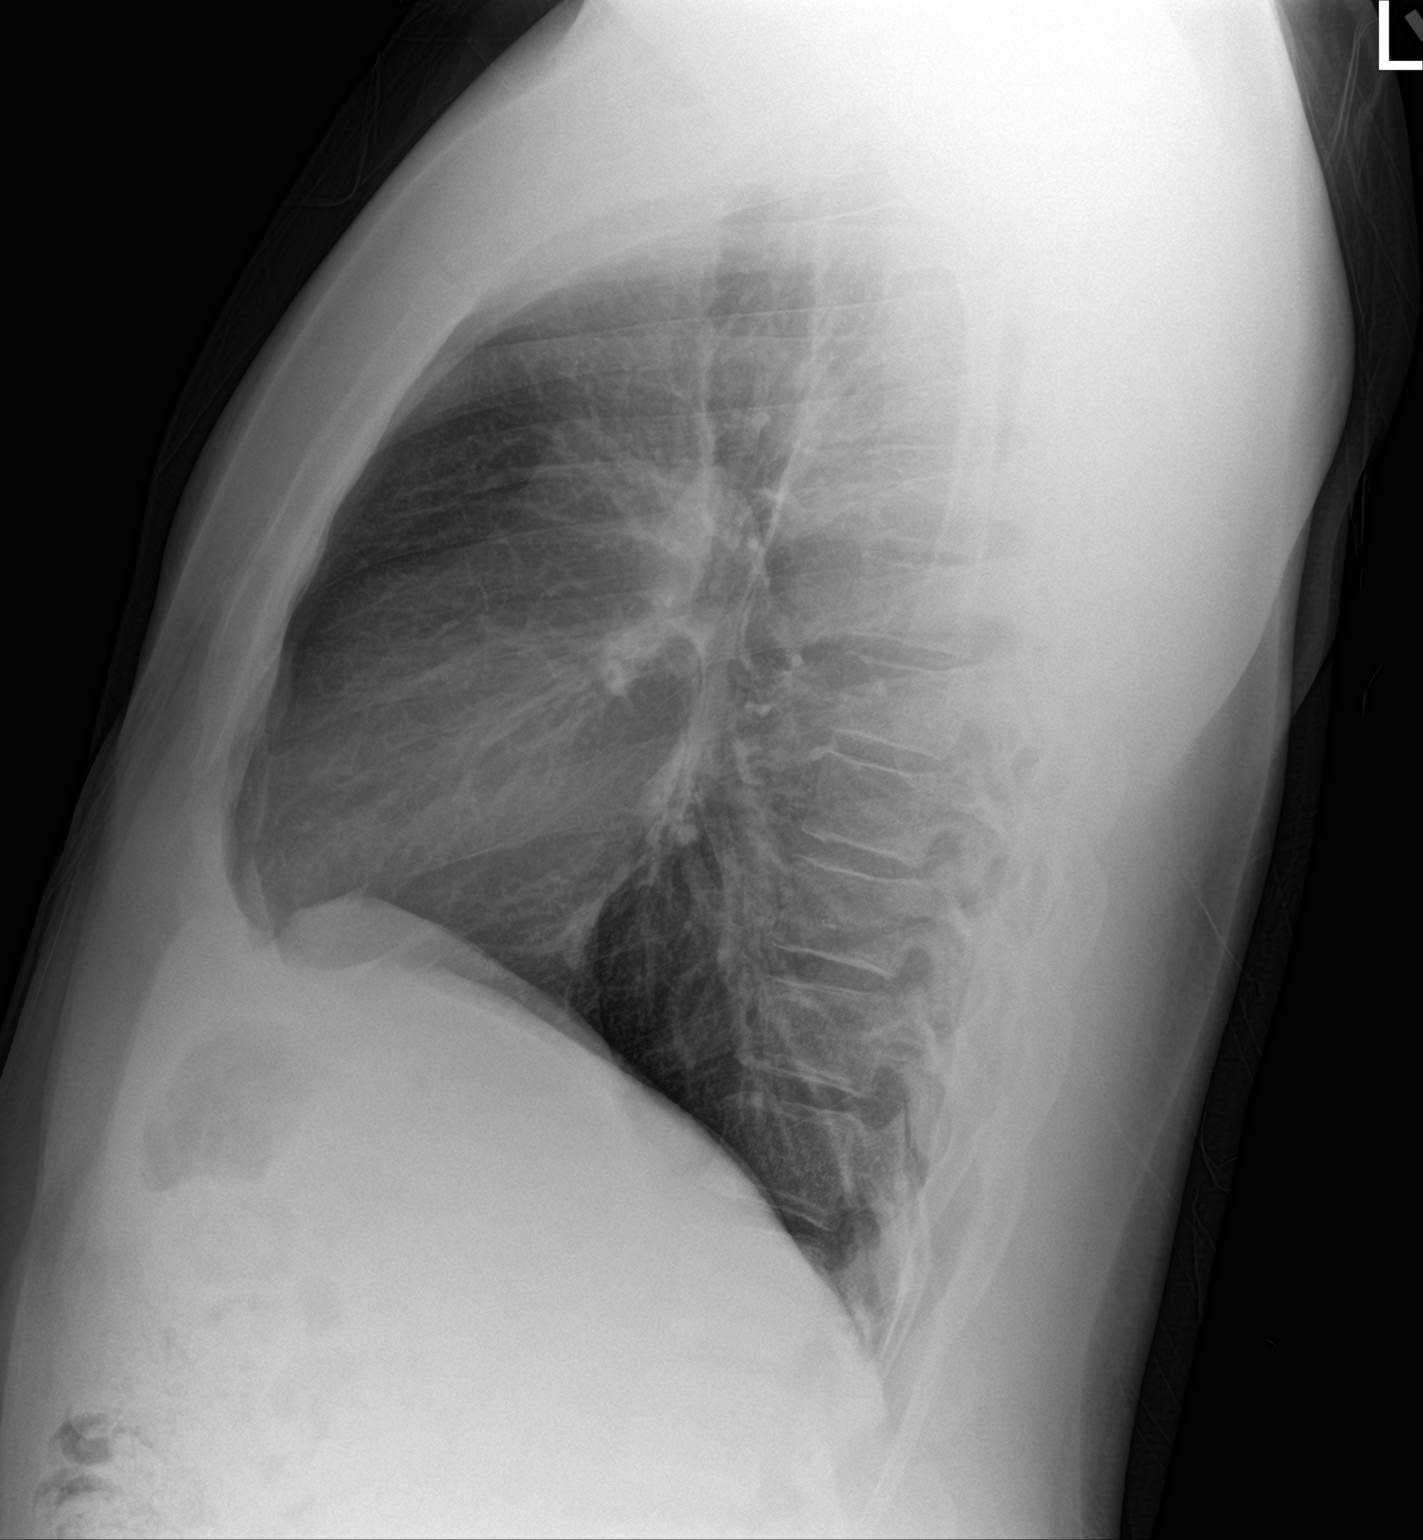

[2 of 2 positions shown; findings below may reference images not displayed]

FINDINGS: The heart size and mediastinal contours are within normal limits.
Both lungs are clear. The visualized skeletal structures are
unremarkable.
IMPRESSION: No active cardiopulmonary disease.

## 2018-12-09 MED ORDER — CHLORPROMAZINE HCL 25 MG PO TABS
25.0000 mg | ORAL_TABLET | Freq: Once | ORAL | Status: AC
  Administered 2018-12-09: 25 mg via ORAL
  Filled 2018-12-09: qty 1

## 2018-12-09 MED ORDER — ALUM & MAG HYDROXIDE-SIMETH 200-200-20 MG/5ML PO SUSP
30.0000 mL | Freq: Once | ORAL | Status: AC
  Administered 2018-12-09: 30 mL via ORAL
  Filled 2018-12-09: qty 30

## 2018-12-09 MED ORDER — LIDOCAINE VISCOUS HCL 2 % MT SOLN
15.0000 mL | Freq: Once | OROMUCOSAL | Status: AC
  Administered 2018-12-09: 15 mL via ORAL
  Filled 2018-12-09: qty 15

## 2018-12-09 MED ORDER — OMEPRAZOLE 20 MG PO CPDR: 20.0000 mg | DELAYED_RELEASE_CAPSULE | Freq: Every day | ORAL | 0 refills | Status: DC

## 2018-12-09 NOTE — ED Notes (Signed)
PT states understanding of care given, follow up care, and medication prescribed. PT ambulated from ED to car with a steady gait. 

## 2018-12-09 NOTE — ED Notes (Signed)
Pt expresses relief from hiccups

## 2018-12-09 NOTE — ED Provider Notes (Signed)
[facility] EMERGENCY DEPARTMENT Provider Note   CSN: [id number] Arrival date & time: 12/08/18  2354    History   Chief Complaint Chief Complaint  Patient presents with  . Hiccups    HPI Jason Sampson is a 38 y.o. male.     The history is provided by the patient.  Illness  Location:  Throat  Severity:  Moderate Onset quality:  Gradual Duration:  4 days Timing:  Intermittent Progression:  Unchanged Chronicity:  Recurrent Context:  Hiccups and is off his GERD meds  Relieved by:  Nothing Worsened by:  Nothing Ineffective treatments:  None tried Associated symptoms: no abdominal pain, no chest pain, no congestion, no cough, no diarrhea, no ear pain, no fatigue, no fever, no headaches, no loss of consciousness, no myalgias, no nausea, no rash, no rhinorrhea, no shortness of breath, no sore throat, no vomiting and no wheezing     Past Medical History:  Diagnosis Date  . Asthma   . GERD (gastroesophageal reflux disease)   . Hypercholesterolemia    PURE  . Hypertension   . Palpitations   . Seasonal allergies     Patient Active Problem List   Diagnosis Date Noted  . Palpitations 12/07/2018  . PVC's (premature ventricular contractions) 12/07/2018  . Neck pain 02/05/2018  . Acute seasonal allergic rhinitis 09/01/2016  . Essential hypertension 11/14/2014  . Complication of internal fixation device (HCC) 09/09/2013  . Pain in left foot 09/09/2013  . Status post foot surgery 04/26/2013  . Plantar fasciitis of left foot 03/30/2013  . Metatarsal deformity 03/30/2013    Past Surgical History:  Procedure Laterality Date  . Lapidus Fusion Left 04/21/2013   @[facility]         Home Medications    Prior to Admission medications   Medication Sig Start Date End Date Taking? Authorizing Provider  amLODipine (NORVASC) 10 MG tablet Take 10 mg by mouth daily.     [provider]  aspirin EC 81 MG tablet Take 81 mg by mouth daily.      [provider]  chlorthalidone (HYGROTON) 25 MG tablet Take 25 mg by mouth daily.    [provider]  fluticasone (FLONASE) 50 MCG/ACT nasal spray Place 1 spray into both nostrils daily.    [provider]  loratadine (CLARITIN) 10 MG tablet Take 10 mg by mouth daily.    [provider]  meloxicam (MOBIC) 15 MG tablet Take 1 tablet (15 mg total) by mouth daily. 02/04/18   Hudnall, Azucena Fallen, MD  pantoprazole (PROTONIX) 40 MG tablet Take 1 tablet (40 mg total) by mouth 2 (two) times daily. 09/02/16   [name], MD  ranitidine (ZANTAC) 150 MG tablet Take 150 mg by mouth daily.    [provider]  SUMAtriptan (IMITREX) 100 MG tablet  01/12/18   [provider]    Family History Family History  Problem Relation Age of Onset  . Lupus Mother   . Sarcoidosis Mother   . Hypertension Mother   . CVA Mother   . Atrial fibrillation Father   . Hypertension Father   . Migraines Sister   . Hypothyroidism Sister     Social History Social History   Tobacco Use  . Smoking status: Never Smoker  . Smokeless tobacco: Never Used  Substance Use Topics  . Alcohol use: No  . Drug use: No     Allergies   Patient has no known allergies.   Review of Systems Review of  Systems  Constitutional: Negative for fatigue and fever.  HENT: Negative for congestion, ear pain, rhinorrhea and sore throat.   Respiratory: Negative for cough, shortness of breath and wheezing.   Cardiovascular: Negative for chest pain.  Gastrointestinal: Negative for abdominal pain, diarrhea, nausea and vomiting.  Musculoskeletal: Negative for myalgias.  Skin: Negative for rash.  Neurological: Negative for loss of consciousness and headaches.  All other systems reviewed and are negative.    Physical Exam Updated Vital Signs BP (!) 139/98 (BP Location: Left Arm)   Pulse 77   Temp 98.1 F (36.7 C) (Oral)   Resp 18   Ht  (1.727 m)   Wt 97.5 kg   SpO2 100%   BMI 32.69  kg/m   Physical Exam Vitals signs and nursing note reviewed.  Constitutional:      Appearance: Normal appearance. He is not toxic-appearing.  HENT:     Head: Normocephalic and atraumatic.     Nose: Nose normal.     Mouth/Throat:     Mouth: Mucous membranes are moist.     Pharynx: Oropharynx is clear.  Eyes:     Conjunctiva/sclera: Conjunctivae normal.     Pupils: Pupils are equal, round, and reactive to light.  Neck:     Musculoskeletal: Normal range of motion and neck supple.  Cardiovascular:     Rate and Rhythm: Normal rate and regular rhythm.     Pulses: Normal pulses.     Heart sounds: Normal heart sounds.  Pulmonary:     Effort: Pulmonary effort is normal.     Breath sounds: Normal breath sounds.  Abdominal:     General: Abdomen is flat. Bowel sounds are normal.     Tenderness: There is no abdominal tenderness. There is no guarding or rebound.  Musculoskeletal: Normal range of motion.  Skin:    General: Skin is warm and dry.     Capillary Refill: Capillary refill takes less than 2 seconds.  Neurological:     General: No focal deficit present.     Mental Status: He is alert and oriented to person, place, and time.  Psychiatric:        Mood and Affect: Mood normal.        Behavior: Behavior normal.      ED Treatments / Results  Labs (all labs ordered are listed, but only abnormal results are displayed) Labs Reviewed - No data to display  EKG None  Radiology No results found.  Procedures Procedures (including critical care time)  Medications Ordered in ED Medications  chlorproMAZINE (THORAZINE) tablet 25 mg (25 mg Oral Given 12/09/18 0013)  alum & mag hydroxide-simeth (MAALOX/MYLANTA) 200-200-20 MG/5ML suspension 30 mL (30 mLs Oral Given 12/09/18 0012)    And  lidocaine (XYLOCAINE) 2 % viscous mouth solution 15 mL (15 mLs Oral Given 12/09/18 0012)      No more hiccups post medication.  Stable for discharge with close follow up restart your GERD  medication   Final Clinical Impressions(s) / ED Diagnoses   Return for intractable cough, coughing up blood,fevers >100.4 unrelieved by medication, shortness of breath, intractable vomiting, chest pain, shortness of breath, weakness,numbness, changes in speech, facial asymmetry,abdominal pain, passing out,Inability to tolerate liquids or food, cough, altered mental status or any concerns. No signs of systemic illness or infection. The patient is nontoxic-appearing on exam and vital signs are within normal limits.   I have reviewed the triage vital signs and the nursing notes. Pertinent labs &imaging results that were  available during my care of the patient were reviewed by me and considered in my medical decision making (see chart for details).  After history, exam, and medical workup I feel the patient has been appropriately medically screened and is safe for discharge home. Pertinent diagnoses were discussed with the patient. Patient was given return precautions.     Terryon Pineiro, MD 12/09/18 [phone]

## 2018-12-10 ENCOUNTER — Encounter (HOSPITAL_COMMUNITY): Payer: Self-pay | Admitting: Emergency Medicine

## 2018-12-10 ENCOUNTER — Emergency Department (HOSPITAL_COMMUNITY)
Admission: EM | Admit: 2018-12-10 | Discharge: 2018-12-10 | Disposition: A | Discharge status: Home / Self Care | Payer: 59 | Attending: Emergency Medicine | Admitting: Emergency Medicine

## 2018-12-10 ENCOUNTER — Other Ambulatory Visit: Payer: Self-pay

## 2018-12-10 DIAGNOSIS — R066 Hiccough: Secondary | ICD-10-CM | POA: Diagnosis present

## 2018-12-10 DIAGNOSIS — Z5321 Procedure and treatment not carried out due to patient leaving prior to being seen by health care provider: Secondary | ICD-10-CM | POA: Insufficient documentation

## 2018-12-10 NOTE — ED Triage Notes (Signed)
C/o hiccups x 6 days.  States he was seen at [facility] 2 days ago and hasn't gotten any better.

## 2018-12-10 NOTE — ED Notes (Signed)
PT went to bathroom and vomited.  States hiccups stopped.  Pt states, "Can I please just go home and go to sleep?"  States he has not been able to sleep since hiccups started.  PT states he will return if symptoms return.

## 2018-12-11 ENCOUNTER — Encounter (HOSPITAL_COMMUNITY): Payer: Self-pay | Admitting: Emergency Medicine

## 2018-12-11 ENCOUNTER — Emergency Department (INDEPENDENT_AMBULATORY_CARE_PROVIDER_SITE_OTHER)
Admission: EM | Admit: 2018-12-11 | Discharge: 2018-12-11 | Disposition: A | Discharge status: Home / Self Care | Payer: 59 | Source: Home / Self Care

## 2018-12-11 ENCOUNTER — Other Ambulatory Visit: Payer: Self-pay

## 2018-12-11 ENCOUNTER — Emergency Department (HOSPITAL_COMMUNITY)
Admission: EM | Admit: 2018-12-11 | Discharge: 2018-12-11 | Disposition: A | Discharge status: Home / Self Care | Payer: 59 | Attending: Emergency Medicine | Admitting: Emergency Medicine

## 2018-12-11 DIAGNOSIS — R066 Hiccough: Secondary | ICD-10-CM | POA: Diagnosis not present

## 2018-12-11 DIAGNOSIS — I10 Essential (primary) hypertension: Secondary | ICD-10-CM | POA: Diagnosis not present

## 2018-12-11 DIAGNOSIS — Z79899 Other long term (current) drug therapy: Secondary | ICD-10-CM | POA: Insufficient documentation

## 2018-12-11 DIAGNOSIS — J45909 Unspecified asthma, uncomplicated: Secondary | ICD-10-CM | POA: Insufficient documentation

## 2018-12-11 DIAGNOSIS — Z7982 Long term (current) use of aspirin: Secondary | ICD-10-CM | POA: Insufficient documentation

## 2018-12-11 MED ORDER — SUCRALFATE 1 GM/10ML PO SUSP: ORAL | 1 refills | Status: DC

## 2018-12-11 MED ORDER — LIDOCAINE VISCOUS HCL 2 % MT SOLN
15.0000 mL | Freq: Once | OROMUCOSAL | Status: AC
  Administered 2018-12-11: 15 mL via ORAL
  Filled 2018-12-11: qty 15

## 2018-12-11 MED ORDER — CHLORPROMAZINE HCL 25 MG PO TABS: 25.0000 mg | ORAL_TABLET | Freq: Three times a day (TID) | ORAL | 0 refills | Status: DC | PRN

## 2018-12-11 MED ORDER — ONDANSETRON 4 MG PO TBDP
4.0000 mg | ORAL_TABLET | Freq: Once | ORAL | Status: AC
  Administered 2018-12-11: 4 mg via ORAL
  Filled 2018-12-11: qty 1

## 2018-12-11 MED ORDER — ALUM & MAG HYDROXIDE-SIMETH 200-200-20 MG/5ML PO SUSP
30.0000 mL | Freq: Once | ORAL | Status: AC
  Administered 2018-12-11: 30 mL via ORAL
  Filled 2018-12-11: qty 30

## 2018-12-11 MED ORDER — CHLORPROMAZINE HCL 25 MG PO TABS
25.0000 mg | ORAL_TABLET | Freq: Once | ORAL | Status: AC
  Administered 2018-12-11: 25 mg via ORAL
  Filled 2018-12-11: qty 1

## 2018-12-11 NOTE — ED Notes (Signed)
Patient verbalizes understanding of discharge instructions . Opportunity for questions and answers were provided . Armband removed by staff ,Pt discharged from ED. W/C  offered at D/C  and Declined W/C at D/C and was escorted to lobby by RN.  

## 2018-12-11 NOTE — Discharge Instructions (Addendum)
Continue chlorpromazine as prescribed.

## 2018-12-11 NOTE — ED Triage Notes (Signed)
Pt came to ED earlier tonight for hiccups x 6 days.  Pt left without being seen after vomited and hiccups stopped.  Pt returns and states hiccups woke him up 1 hour ago.

## 2018-12-11 NOTE — ED Provider Notes (Signed)
[facility] EMERGENCY DEPARTMENT Provider Note   CSN: [id number] Arrival date & time: 12/11/18  0129    History   Chief Complaint Chief Complaint  Patient presents with  . Hiccups    HPI Jason Sampson is a 38 y.o. male.     The history is provided by the patient.  Patient presents for hiccups for past 6 days Seen on March 12 for hiccups.  He was given oral medications in the ER at that time and was  improved.  He was discharged home on Prilosec.  He reports that the hiccups returned and he was seen at a walk-in clinic and started on Reglan. He returns tonight for repeat evaluation due to persistent hiccups.  He is able to take p.o. fluids but then will vomit at times.  No fevers.  No chest pain.  No other acute complaints.  His course is worsening  Past Medical History:  Diagnosis Date  . Asthma   . GERD (gastroesophageal reflux disease)   . Hypercholesterolemia    PURE  . Hypertension   . Palpitations   . Seasonal allergies     Patient Active Problem List   Diagnosis Date Noted  . Palpitations 12/07/2018  . PVC's (premature ventricular contractions) 12/07/2018  . Neck pain 02/05/2018  . Acute seasonal allergic rhinitis 09/01/2016  . Essential hypertension 11/14/2014  . Complication of internal fixation device (HCC) 09/09/2013  . Pain in left foot 09/09/2013  . Status post foot surgery 04/26/2013  . Plantar fasciitis of left foot 03/30/2013  . Metatarsal deformity 03/30/2013    Past Surgical History:  Procedure Laterality Date  . Lapidus Fusion Left 04/21/2013   @[facility]         Home Medications    Prior to Admission medications   Medication Sig Start Date End Date Taking? Authorizing Provider  amLODipine (NORVASC) 10 MG tablet Take 10 mg by mouth daily.     [provider]  aspirin EC 81 MG tablet Take 81 mg by mouth daily.      [provider]  chlorthalidone (HYGROTON) 25 MG tablet Take 25 mg by mouth daily.     [provider]  fluticasone (FLONASE) 50 MCG/ACT nasal spray Place 1 spray into both nostrils daily.    [provider]  loratadine (CLARITIN) 10 MG tablet Take 10 mg by mouth daily.    [provider]  meloxicam (MOBIC) 15 MG tablet Take 1 tablet (15 mg total) by mouth daily. 02/04/18   Hudnall, Azucena Fallen, MD  omeprazole (PRILOSEC) 20 MG capsule Take 1 capsule (20 mg total) by mouth daily. 12/09/18   Palumbo, April, MD  pantoprazole (PROTONIX) 40 MG tablet Take 1 tablet (40 mg total) by mouth 2 (two) times daily. 09/02/16   [name], MD  ranitidine (ZANTAC) 150 MG tablet Take 150 mg by mouth daily.    [provider]  SUMAtriptan (IMITREX) 100 MG tablet  01/12/18   [provider]    Family History Family History  Problem Relation Age of Onset  . Lupus Mother   . Sarcoidosis Mother   . Hypertension Mother   . CVA Mother   . Atrial fibrillation Father   . Hypertension Father   . Migraines Sister   . Hypothyroidism Sister     Social History Social History   Tobacco Use  . Smoking status: Never Smoker  . Smokeless tobacco: Never Used  Substance Use Topics  . Alcohol use: No  . Drug use:  No     Allergies   Patient has no known allergies.   Review of Systems Review of Systems  Constitutional: Negative for fever.  HENT:       Hiccups  Respiratory: Negative for shortness of breath.   Cardiovascular: Negative for chest pain.  Gastrointestinal: Positive for vomiting.  All other systems reviewed and are negative.    Physical Exam Updated Vital Signs BP (!) 161/98 (BP Location: Left Arm)   Pulse 89   Temp 98.6 F (37 C) (Oral)   Resp 16   SpO2 98%   Physical Exam CONSTITUTIONAL: Well developed/well nourished, hiccuping during exam HEAD: Normocephalic/atraumatic EYES: EOMI ENMT: Mucous membranes moist uvula midline without erythema or exudates, no stridor NECK: supple no meningeal signs SPINE/BACK:entire spine  nontender CV: S1/S2 noted, no murmurs/rubs/gallops noted LUNGS: Lungs are clear to auscultation bilaterally, no apparent distress ABDOMEN: soft, nontender GU:no cva tenderness NEURO: Pt is awake/alert/appropriate, moves all extremitiesx4.   EXTREMITIES: pulses normal/equal, full ROM SKIN: warm, color normal PSYCH: no abnormalities of mood noted, alert and oriented to situation   ED Treatments / Results  Labs (all labs ordered are listed, but only abnormal results are displayed) Labs Reviewed - No data to display  EKG None  Radiology No results found.  Procedures Procedures (including critical care time)  Medications Ordered in ED Medications  alum & mag hydroxide-simeth (MAALOX/MYLANTA) 200-200-20 MG/5ML suspension 30 mL (30 mLs Oral Given 12/11/18 0459)    And  lidocaine (XYLOCAINE) 2 % viscous mouth solution 15 mL (15 mLs Oral Given 12/11/18 0459)  chlorproMAZINE (THORAZINE) tablet 25 mg (25 mg Oral Given 12/11/18 0458)     Initial Impression / Assessment and Plan / ED Course  I have reviewed the triage vital signs and the nursing notes.         5:24 AM Patient seen in the ER on 3/12 He had a negative chest x-ray at that time. We will repeat his medications from that visit and reassess 6:46 AM Patient resting comfortably and feeling improved.  This is the second time receiving Thorazine and he reports it helped his hiccups. He would like to try this at home as needed.  He will stop the Reglan. Will refer to gastroenterology. Final Clinical Impressions(s) / ED Diagnoses   Final diagnoses:  Hiccups    ED Discharge Orders         Ordered    chlorproMAZINE (THORAZINE) 25 MG tablet  3 times daily PRN     12/11/18 0636           Zadie Rhine, MD 12/11/18 [phone]

## 2018-12-11 NOTE — ED Triage Notes (Addendum)
Pt c/o hiccups x 6 days. Was seen at [facility] on 3/12 and then [facility] ED last night. Given info on seeing a GI next week. BP is currently elevated but pt hasn't taken his PM BP meds.

## 2018-12-11 NOTE — ED Notes (Signed)
Pt reports his hiccups have stopped and he is feeling much better.

## 2018-12-11 NOTE — ED Provider Notes (Signed)
[facility]    CSN: [id number] Arrival date & time: 12/11/18  1746     History   Chief Complaint Chief Complaint  Patient presents with  . Hiccups    HPI Jason Sampson is a 38 y.o. male.   Patient reports that he has had hiccups for about 6 days. He was evaluated in an ER on March 12 where he was given oral meds with improvement and discharged with a prescription for Prilosec.  His hiccups recurred and he presented to an urgent care center where he was prescribed Reglan.  He presented to [facility] ER early this morning for persistent hiccups where he was prescribed oral Thorazine 25mg  TID and advised to follow-up with GI.  He has not yet filled his Thorazine Rx and presents here for further advice.  He states that whenever he has hiccups, he finds that Karo syrup can be helpful.  He is presently assymptomatic.      Past Medical History:  Diagnosis Date  . Asthma   . GERD (gastroesophageal reflux disease)   . Hypercholesterolemia    PURE  . Hypertension   . Palpitations   . Seasonal allergies     Patient Active Problem List   Diagnosis Date Noted  . Palpitations 12/07/2018  . PVC's (premature ventricular contractions) 12/07/2018  . Neck pain 02/05/2018  . Acute seasonal allergic rhinitis 09/01/2016  . Essential hypertension 11/14/2014  . Complication of internal fixation device (HCC) 09/09/2013  . Pain in left foot 09/09/2013  . Status post foot surgery 04/26/2013  . Plantar fasciitis of left foot 03/30/2013  . Metatarsal deformity 03/30/2013    Past Surgical History:  Procedure Laterality Date  . Lapidus Fusion Left 04/21/2013   @[facility]        Home Medications    Prior to Admission medications   Medication Sig Start Date End Date Taking? Authorizing Provider  amLODipine (NORVASC) 10 MG tablet Take 10 mg by mouth daily.     [provider]  aspirin EC 81 MG tablet Take 81 mg by mouth daily.      [provider]   atorvastatin (LIPITOR) 10 MG tablet Take 10 mg by mouth daily. 08/18/18   [provider]  cetirizine (ZYRTEC) 10 MG tablet Take 10 mg by mouth daily.    [provider]  chlorproMAZINE (THORAZINE) 25 MG tablet Take 1 tablet (25 mg total) by mouth 3 (three) times daily as needed for hiccoughs. 12/11/18   Zadie Rhine, MD  chlorthalidone (HYGROTON) 25 MG tablet Take 25 mg by mouth daily.    [provider]  fluticasone (FLONASE) 50 MCG/ACT nasal spray Place 1 spray into both nostrils daily as needed for allergies.     [provider]  metoCLOPramide (REGLAN) 10 MG tablet Take 10 mg by mouth 2 (two) times daily.    [provider]  omeprazole (PRILOSEC) 20 MG capsule Take 1 capsule (20 mg total) by mouth daily. 12/09/18   Palumbo, April, MD  pantoprazole (PROTONIX) 40 MG tablet Take 1 tablet (40 mg total) by mouth 2 (two) times daily. Patient not taking: Reported on 12/11/2018 09/02/16   [name], MD  simethicone (MYLICON) 125 MG chewable tablet Chew 125 mg by mouth every 6 (six) hours as needed for flatulence.    [provider]  sucralfate (CARAFATE) 1 GM/10ML suspension Take 58mL PO QID 12/11/18   Lattie Haw, MD  SUMAtriptan (IMITREX) 100 MG tablet Take 100 mg by mouth every 2 (two)  hours as needed for migraine.  01/12/18   [provider]    Family History Family History  Problem Relation Age of Onset  . Lupus Mother   . Sarcoidosis Mother   . Hypertension Mother   . CVA Mother   . Atrial fibrillation Father   . Hypertension Father   . Migraines Sister   . Hypothyroidism Sister     Social History Social History   Tobacco Use  . Smoking status: Never Smoker  . Smokeless tobacco: Never Used  Substance Use Topics  . Alcohol use: No  . Drug use: No     Allergies   Patient has no known allergies.   Review of Systems Review of Systems  Constitutional: Negative for activity change, appetite change,  chills, diaphoresis, fatigue, fever and unexpected weight change.  HENT: Negative.   Eyes: Negative.   Respiratory: Negative.   Cardiovascular: Negative.   Gastrointestinal: Negative for abdominal distention, abdominal pain, blood in stool, diarrhea, nausea and vomiting.  Genitourinary: Negative.   Musculoskeletal: Negative.   Skin: Negative.   Neurological: Negative for headaches.     Physical Exam Triage Vital Signs ED Triage Vitals  Enc Vitals Group     BP 12/11/18 1845 (!) 151/104     Pulse Rate 12/11/18 1845 (!) 115     Resp 12/11/18 1845 16     Temp 12/11/18 1845 98.1 F (36.7 C)     Temp Source 12/11/18 1845 Oral     SpO2 --      Weight 12/11/18 1847 213 lb 13.5 oz (97 kg)     Height 12/11/18 1847 5\' 8"  (1.727 m)     Head Circumference --      Peak Flow --      Pain Score 12/11/18 1847 0     Pain Loc --      Pain Edu? --      Excl. in GC? --    No data found.  Updated Vital Signs BP (!) 151/104 (BP Location: Right Arm)   Pulse (!) 115   Temp 98.1 F (36.7 C) (Oral)   Resp 16   Ht 5\' 8"  (1.727 m)   Wt 97 kg   BMI 32.52 kg/m   Visual Acuity Right Eye Distance:   Left Eye Distance:   Bilateral Distance:    Right Eye Near:   Left Eye Near:    Bilateral Near:     Physical Exam Nursing notes and Vital Signs reviewed. Appearance:  Patient appears stated age, and in no acute distress.    Eyes:  Pupils are equal, round, and reactive to light and accomodation.  Extraocular movement is intact.  Conjunctivae are not inflamed   Pharynx:  Normal; moist mucous membranes  Neck:  Supple.  No adenopathy Lungs:  Clear to auscultation.  Breath sounds are equal.  Moving air well. Heart:  Regular rate and rhythm without murmurs, rubs, or gallops.  Abdomen:  Nontender without masses or hepatosplenomegaly.  Bowel sounds are present.  No CVA or flank tenderness.  Extremities:  No edema.  Skin:  No rash present.     UC Treatments / Results  Labs (all labs ordered are  listed, but only abnormal results are displayed) Labs Reviewed - No data to display  EKG None  Radiology No results found.  Procedures Procedures (including critical care time)  Medications Ordered in UC Medications - No data to display  Initial Impression / Assessment and Plan / UC Course  I have  reviewed the triage vital signs and the nursing notes.  Pertinent labs & imaging results that were available during my care of the patient were reviewed by me and considered in my medical decision making (see chart for details).    Begin trial of Carafate.  Continue Thorazine. Followup with GI as soon as possible.    Final Clinical Impressions(s) / UC Diagnoses   Final diagnoses:  Hiccups     Discharge Instructions     Continue chlorpromazine as prescribed.    ED Prescriptions    Medication Sig Dispense Auth. Provider   sucralfate (CARAFATE) 1 GM/10ML suspension Take 10mL PO QID 420 mL Lattie Haw, MD        Lattie Haw, MD 12/14/18 1137

## 2018-12-14 ENCOUNTER — Telehealth: Payer: Self-pay

## 2018-12-14 NOTE — Telephone Encounter (Signed)
Covid-19 travel screening questions  Have you traveled in the last 14 days? no If yes where?  Do you now or have you had a fever in the last 14 days? no  Do you have any respiratory symptoms of shortness of breath or cough now or in the last 14 days? Yes- took him to the ED hiccups  Do you have a medical history of Congestive Heart Failure? no  Do you have a medical history of lung disease? no  Do you have any family members or close contacts with diagnosed or suspected Covid-19? no

## 2018-12-15 ENCOUNTER — Encounter: Payer: Self-pay | Admitting: Gastroenterology

## 2018-12-15 ENCOUNTER — Ambulatory Visit: Payer: 59 | Admitting: Gastroenterology

## 2018-12-15 ENCOUNTER — Other Ambulatory Visit: Payer: Self-pay

## 2018-12-15 VITALS — BP 130/84 | HR 76 | Temp 97.8°F | Ht 67.0 in | Wt 204.0 lb

## 2018-12-15 DIAGNOSIS — R066 Hiccough: Secondary | ICD-10-CM | POA: Diagnosis not present

## 2018-12-15 DIAGNOSIS — K2100 Gastro-esophageal reflux disease with esophagitis, without bleeding: Secondary | ICD-10-CM

## 2018-12-15 DIAGNOSIS — K5900 Constipation, unspecified: Secondary | ICD-10-CM

## 2018-12-15 MED ORDER — OMEPRAZOLE 40 MG PO CPDR: 40.0000 mg | DELAYED_RELEASE_CAPSULE | Freq: Two times a day (BID) | ORAL | 11 refills | Status: DC

## 2018-12-15 NOTE — Patient Instructions (Addendum)
We have sent the following medications to your pharmacy for you to pick up at your convenience: omeprazole 40 mg twice daily.   You can try Mylanta liquid over the counter four times a day as needed.   You have been scheduled for an endoscopy. Please follow written instructions given to you at your visit today. If you use inhalers (even only as needed), please bring them with you on the day of your procedure. Your physician has requested that you go to www.startemmi.com and enter the access code given to you at your visit today. This web site gives a general overview about your procedure. However, you should still follow specific instructions given to you by our office regarding your preparation for the procedure.  Thank you for choosing me and [facility].  Venita Lick. Pleas Koch., MD., [facility]  To help prevent the possible spread of infection to our patients, communities, and staff; we will be implementing the following measures:  Please only allow one visitor/family member to accompany you to any upcoming appointments with [facility]. If you have any concerns about this please contact our office to discuss prior to the appointment.

## 2018-12-15 NOTE — Progress Notes (Signed)
History of Present Illness: This is a 38 year old male referred by Renford Dills, MD for the evaluation of nausea, vomiting, hiccups for about 10 days.  He has been evaluated in EDs and urgent care on March 12 and twice on March 14.  He has been treated with omeprazole and Thorazine.  His symptoms have had modest improvement.  He relates a history of GERD treated in the past.  Symptoms were not this severe.  He has difficulty swallowing certain foods and liquids however on further questioning his symptoms sound more typical for regurgitation then nausea, vomiting or dysphagia. He relates mild constipation and recently took magnesium citrate which was effective.  He denies weight loss, abdominal pain, diarrhea, change in stool caliber, melena, hematochezia, chest pain.   No Known Allergies Outpatient Medications Prior to Visit  Medication Sig Dispense Refill  . amLODipine (NORVASC) 10 MG tablet Take 10 mg by mouth daily.     Marland Kitchen aspirin EC 81 MG tablet Take 81 mg by mouth daily.      Marland Kitchen atorvastatin (LIPITOR) 10 MG tablet Take 10 mg by mouth daily.    . cetirizine (ZYRTEC) 10 MG tablet Take 10 mg by mouth daily.    . chlorproMAZINE (THORAZINE) 25 MG tablet Take 1 tablet (25 mg total) by mouth 3 (three) times daily as needed for hiccoughs. 30 tablet 0  . chlorthalidone (HYGROTON) 25 MG tablet Take 25 mg by mouth daily.    . fluticasone (FLONASE) 50 MCG/ACT nasal spray Place 1 spray into both nostrils daily as needed for allergies.     Marland Kitchen omeprazole (PRILOSEC) 20 MG capsule Take 1 capsule (20 mg total) by mouth daily. 30 capsule 0  . simethicone (MYLICON) 125 MG chewable tablet Chew 125 mg by mouth every 6 (six) hours as needed for flatulence.    . metoCLOPramide (REGLAN) 10 MG tablet Take 10 mg by mouth 2 (two) times daily.    . pantoprazole (PROTONIX) 40 MG tablet Take 1 tablet (40 mg total) by mouth 2 (two) times daily. (Patient not taking: Reported on 12/11/2018) 14 tablet 0  . sucralfate  (CARAFATE) 1 GM/10ML suspension Take 33mL PO QID 420 mL 1  . SUMAtriptan (IMITREX) 100 MG tablet Take 100 mg by mouth every 2 (two) hours as needed for migraine.      No facility-administered medications prior to visit.    Past Medical History:  Diagnosis Date  . Asthma   . GERD (gastroesophageal reflux disease)   . Hypercholesterolemia    PURE  . Hypertension   . Palpitations   . Seasonal allergies    Past Surgical History:  Procedure Laterality Date  . FOOT SURGERY    . Lapidus Fusion Left 04/21/2013   @[facility]    Social History   Socioeconomic History  . Marital status: Married    Spouse name: Not on file  . Number of children: 4  . Years of education: Not on file  . Highest education level: Not on file  Occupational History  . Not on file  Social Needs  . Financial resource strain: Not on file  . Food insecurity:    Worry: Not on file    Inability: Not on file  . Transportation needs:    Medical: Not on file    Non-medical: Not on file  Tobacco Use  . Smoking status: Never Smoker  . Smokeless tobacco: Never Used  Substance and Sexual Activity  . Alcohol use: No  . Drug use: No  .  Sexual activity: Not on file  Lifestyle  . Physical activity:    Days per week: Not on file    Minutes per session: Not on file  . Stress: Not on file  Relationships  . Social connections:    Talks on phone: Not on file    Gets together: Not on file    Attends religious service: Not on file    Active member of club or organization: Not on file    Attends meetings of clubs or organizations: Not on file    Relationship status: Not on file  Other Topics Concern  . Not on file  Social History Narrative  . Not on file   Family History  Problem Relation Age of Onset  . Lupus Mother   . Sarcoidosis Mother   . Hypertension Mother   . CVA Mother   . Atrial fibrillation Father   . Hypertension Father   . Migraines Sister   . Hypothyroidism Sister        Review of Systems:  Pertinent positive and negative review of systems were noted in the above HPI section. All other review of systems were otherwise negative.   Physical Exam: General: Well developed, well nourished, no acute distress Head: Normocephalic and atraumatic Eyes:  sclerae anicteric, EOMI Ears: Normal auditory acuity Mouth: No deformity or lesions Neck: Supple, no masses or thyromegaly Lungs: Clear throughout to auscultation Heart: Regular rate and rhythm; no murmurs, rubs or bruits Abdomen: Soft, non tender and non distended. No masses, hepatosplenomegaly or hernias noted. Normal Bowel sounds Rectal: Musculoskeletal: Symmetrical with no gross deformities  Skin: No lesions on visible extremities Pulses:  Normal pulses noted Extremities: No clubbing, cyanosis, edema or deformities noted Neurological: Alert oriented x 4, grossly nonfocal Cervical Nodes:  No significant cervical adenopathy Inguinal Nodes: No significant inguinal adenopathy Psychological:  Alert and cooperative. Normal mood and affect   Assessment and Recommendations:  1. Hiccups, GERD with regurgitation and suspected esophagitis.  Increase omeprazole to 40 mg p.o. twice daily before breakfast and dinner.  Mylanta 4 times daily as needed.  Continue Thorazine 3 times daily as needed.  Liquids and soft foods only until endoscopy completed.  Follow all standard antireflux measures. Schedule EGD. The risks (including bleeding, perforation, infection, missed lesions, medication reactions and possible hospitalization or surgery if complications occur), benefits, and alternatives to endoscopy with possible biopsy and possible dilation were discussed with the patient and they consent to proceed.   2.  Constipation.  MiraLAX daily as needed.    cc: Renford Dills, MD [address] [place], [place] [address]

## 2018-12-16 ENCOUNTER — Telehealth: Payer: Self-pay

## 2018-12-16 ENCOUNTER — Ambulatory Visit: Payer: 59 | Admitting: Gastroenterology

## 2018-12-16 ENCOUNTER — Telehealth: Payer: Self-pay | Admitting: Gastroenterology

## 2018-12-16 NOTE — Telephone Encounter (Signed)
Patient went to the urgent care last night for difficulty breathing.  He was started on oral steroids after an injection of steroids and given breathing treatment.Marland Kitchen  He feels better today and the hiccups seem to have resolved.  He is rescheduled to 12/31/18.  He will call back if his symptoms resolve or worsen

## 2018-12-16 NOTE — Telephone Encounter (Signed)
Covid-19 travel screening questions  Have you traveled in the last 14 days? If yes where?  Do you now or have you had a fever in the last 14 days?  Do you have any respiratory symptoms of shortness of breath or cough now or in the last 14 days?  Do you have a medical history of Congestive Heart Failure?  Do you have a medical history of lung disease?  Do you have any family members or close contacts with diagnosed or suspected Covid-19?      Left a more detailed message this second time at 2:55 about screening questions and our new care partner policy.

## 2018-12-16 NOTE — Telephone Encounter (Signed)
Pt has a endo tomorrow with Dr. Russella Dar. PT went to [facility] yesterday and was given some medication that helped his airway get through and all his symptoms went away. His question is if he still needs the procedure tomorrow?

## 2018-12-16 NOTE — Telephone Encounter (Signed)
Pt return cll

## 2018-12-16 NOTE — Telephone Encounter (Signed)
Covid-19 travel screening questions  Have you traveled in the last 14 days? If yes where?  Do you now or have you had a fever in the last 14 days?  Do you have any respiratory symptoms of shortness of breath or cough now or in the last 14 days?  Do you have a medical history of Congestive Heart Failure?  Do you have a medical history of lung disease?  Do you have any family members or close contacts with diagnosed or suspected Covid-19?      Left message at 10:24 for patient to call back regarding screening survey.

## 2018-12-17 ENCOUNTER — Encounter: Payer: 59 | Admitting: Gastroenterology

## 2018-12-23 ENCOUNTER — Telehealth: Payer: Self-pay

## 2018-12-23 NOTE — Telephone Encounter (Signed)
Left message for patient telling him we would have to cancel his procedure for 4/3 due to COVID-19 but we will definitely call him back to reschedule when we are up and running again. I also gave him our number if he has any questions or concerns.

## 2018-12-31 ENCOUNTER — Encounter: Payer: 59 | Admitting: Gastroenterology

## 2019-01-07 ENCOUNTER — Telehealth: Payer: Self-pay | Admitting: *Deleted

## 2019-01-07 NOTE — Telephone Encounter (Signed)
lmtcb- need schedule  E-visit for 4/16 w/ Dr Herbie Baltimore,  If patient would like to reschedule can move to may or June 2020.

## 2019-01-10 ENCOUNTER — Telehealth: Payer: Self-pay | Admitting: Cardiology

## 2019-01-10 NOTE — Telephone Encounter (Signed)
lmsg - pt is to call back to have evisit on 4/16 or r/s to august per [facility].

## 2019-01-12 NOTE — Telephone Encounter (Signed)
SEVERAL ATTEMPTS TO SCHEDULE APPT FOR TELEVISIT UNABLE REACH PATIENT - APPT CANCELLED PLACE IN RECALL FOR SEPT 2020

## 2019-01-12 NOTE — Telephone Encounter (Signed)
SEVERAL ATTEMPTS TO SCHEDULE APPT FOR TELEVISIT UNABLE REACH PATIENT - APPT CANCELLED PLACE IN RECALL FOR SEPT 2020  

## 2019-01-13 ENCOUNTER — Ambulatory Visit: Payer: Self-pay | Admitting: Cardiology

## 2019-01-31 ENCOUNTER — Telehealth: Payer: Self-pay | Admitting: *Deleted

## 2019-01-31 NOTE — Telephone Encounter (Signed)
Attempted to call patient to reschedule previously cancelled procedure due to Covid-19. LM on patient's home phone number to call back to reschedule, need to review instructions.

## 2019-02-02 NOTE — Telephone Encounter (Signed)
Attempted to reach patient a second time. No answer. Left message for patient to call to reschedule EGD previously cancelled due to Covid-19.

## 2019-02-07 NOTE — Telephone Encounter (Signed)
3rd attempt to call patient to reschedule endoscopy. LMOM

## 2019-03-22 ENCOUNTER — Encounter: Payer: Self-pay | Admitting: Gastroenterology

## 2019-06-28 ENCOUNTER — Emergency Department (HOSPITAL_BASED_OUTPATIENT_CLINIC_OR_DEPARTMENT_OTHER): Payer: 59

## 2019-06-28 ENCOUNTER — Other Ambulatory Visit: Payer: Self-pay

## 2019-06-28 ENCOUNTER — Emergency Department (HOSPITAL_BASED_OUTPATIENT_CLINIC_OR_DEPARTMENT_OTHER)
Admission: EM | Admit: 2019-06-28 | Discharge: 2019-06-28 | Disposition: A | Discharge status: Home / Self Care | Payer: 59 | Attending: Emergency Medicine | Admitting: Emergency Medicine

## 2019-06-28 DIAGNOSIS — J45909 Unspecified asthma, uncomplicated: Secondary | ICD-10-CM | POA: Diagnosis not present

## 2019-06-28 DIAGNOSIS — I10 Essential (primary) hypertension: Secondary | ICD-10-CM | POA: Diagnosis not present

## 2019-06-28 DIAGNOSIS — E876 Hypokalemia: Secondary | ICD-10-CM | POA: Diagnosis not present

## 2019-06-28 DIAGNOSIS — Z7982 Long term (current) use of aspirin: Secondary | ICD-10-CM | POA: Diagnosis not present

## 2019-06-28 DIAGNOSIS — R079 Chest pain, unspecified: Secondary | ICD-10-CM | POA: Diagnosis present

## 2019-06-28 DIAGNOSIS — Z79899 Other long term (current) drug therapy: Secondary | ICD-10-CM | POA: Insufficient documentation

## 2019-06-28 DIAGNOSIS — R7989 Other specified abnormal findings of blood chemistry: Secondary | ICD-10-CM | POA: Diagnosis not present

## 2019-06-28 DIAGNOSIS — R778 Other specified abnormalities of plasma proteins: Secondary | ICD-10-CM

## 2019-06-28 DIAGNOSIS — R072 Precordial pain: Secondary | ICD-10-CM

## 2019-06-28 LAB — CBC
HCT: 47 % (ref 39.0–52.0)
Hemoglobin: 15.9 g/dL (ref 13.0–17.0)
MCH: 29.3 pg (ref 26.0–34.0)
MCHC: 33.8 g/dL (ref 30.0–36.0)
MCV: 86.6 fL (ref 80.0–100.0)
Platelets: 251 10*3/uL (ref 150–400)
RBC: 5.43 MIL/uL (ref 4.22–5.81)
RDW: 11.9 % (ref 11.5–15.5)
WBC: 7.1 10*3/uL (ref 4.0–10.5)
nRBC: 0 % (ref 0.0–0.2)

## 2019-06-28 LAB — BASIC METABOLIC PANEL
Anion gap: 11 (ref 5–15)
BUN: 15 mg/dL (ref 6–20)
CO2: 29 mmol/L (ref 22–32)
Calcium: 9.5 mg/dL (ref 8.9–10.3)
Chloride: 97 mmol/L — ABNORMAL LOW (ref 98–111)
Creatinine, Ser: 1.02 mg/dL (ref 0.61–1.24)
GFR calc Af Amer: >60 mL/min (ref >60)
GFR calc non Af Amer: >60 mL/min (ref >60)
Glucose, Bld: 91 mg/dL (ref 70–99)
Potassium: 2.7 mmol/L — CL (ref 3.5–5.1)
Sodium: 137 mmol/L (ref 135–145)

## 2019-06-28 LAB — TROPONIN I (HIGH SENSITIVITY)
Troponin I (High Sensitivity): 22 ng/L — ABNORMAL HIGH (ref <18)
Troponin I (High Sensitivity): 22 ng/L — ABNORMAL HIGH (ref <18)

## 2019-06-28 LAB — D-DIMER, QUANTITATIVE: D-Dimer, Quant: <0.27 ug/mL-FEU (ref 0.00–0.50)

## 2019-06-28 IMAGING — DX DG CHEST 2V
2 series · 2 of 2 positions shown · non-contrast
Comparison: [DATE]

CLINICAL DATA: Chest pain

EXAM:
CHEST - 2 VIEW

[chest pa]
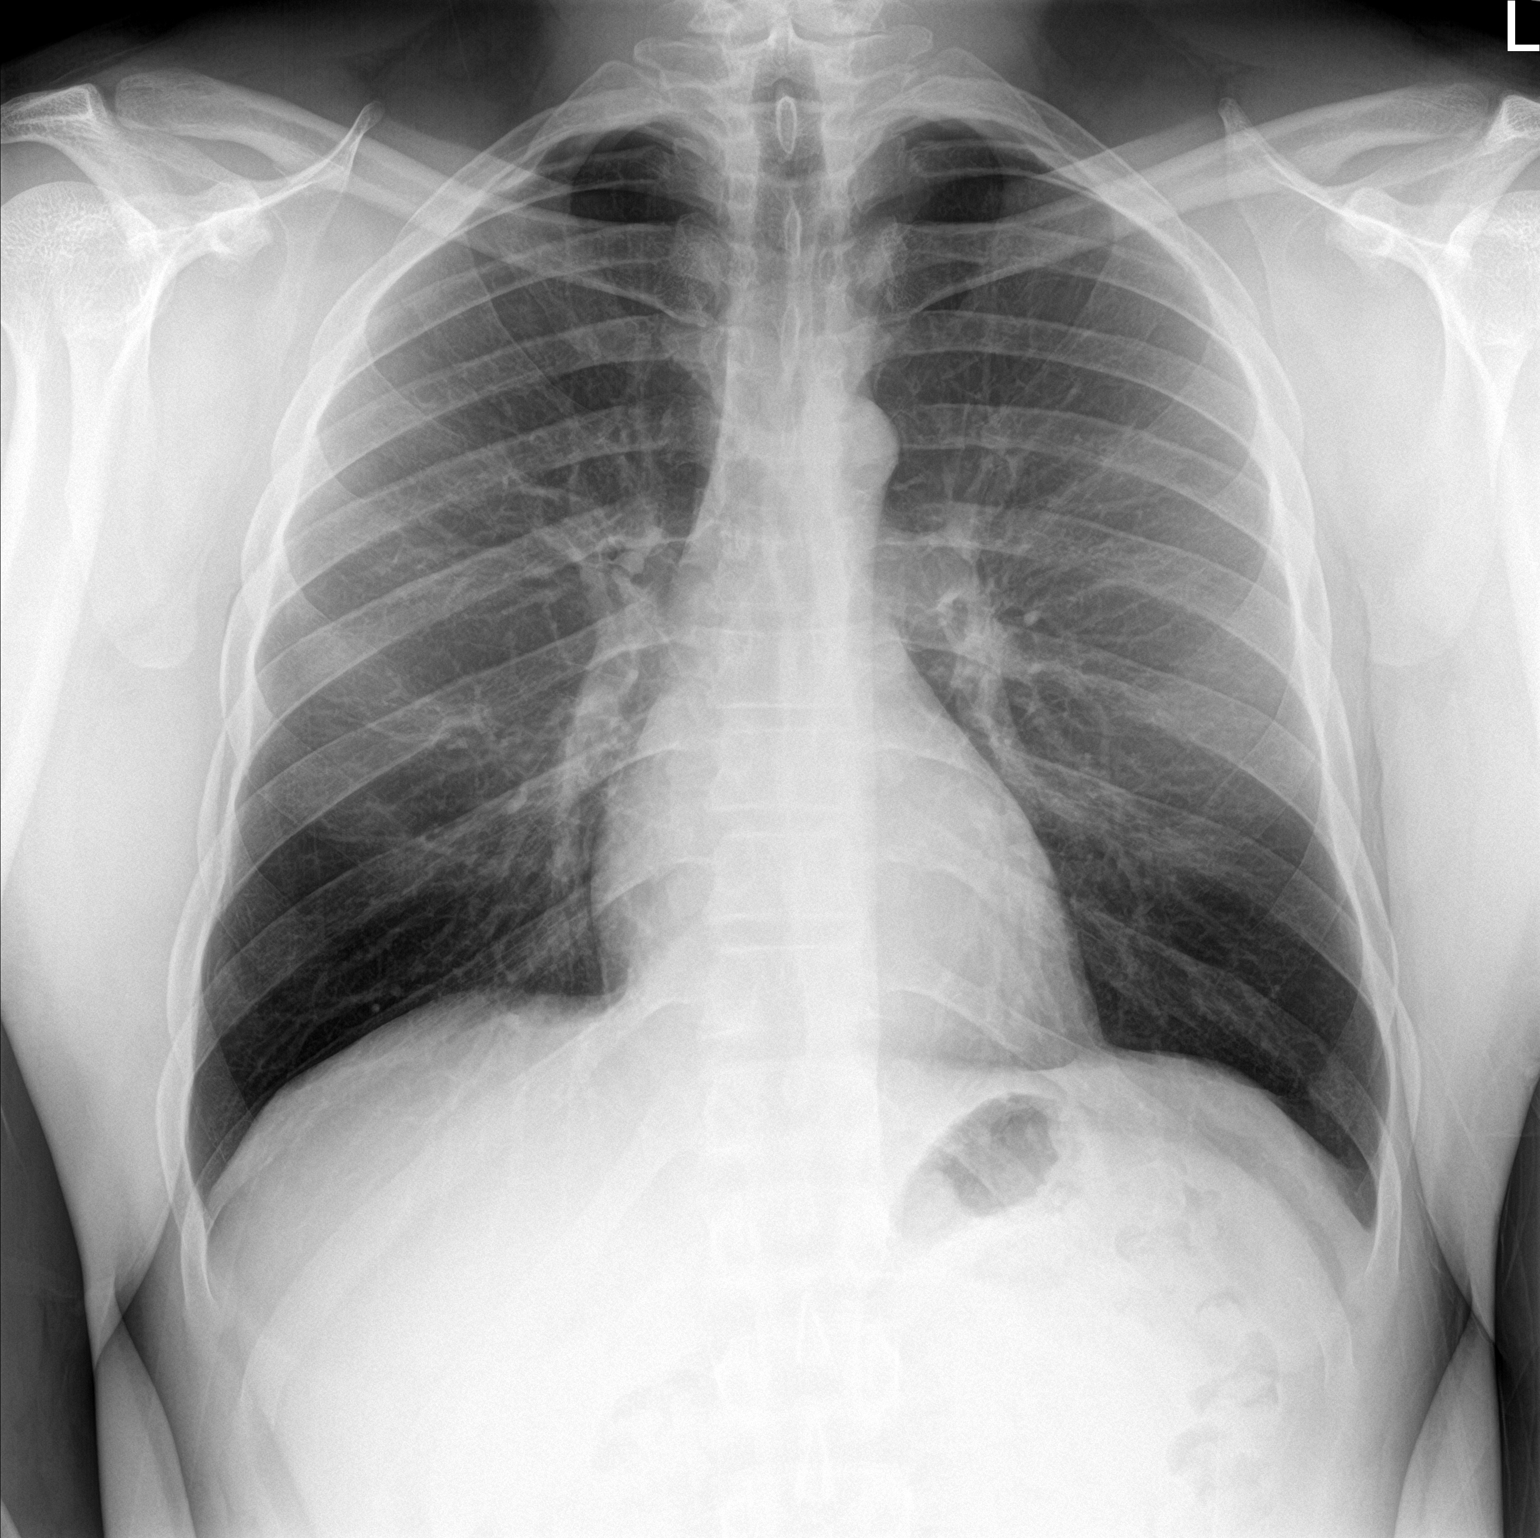

[chest lat]
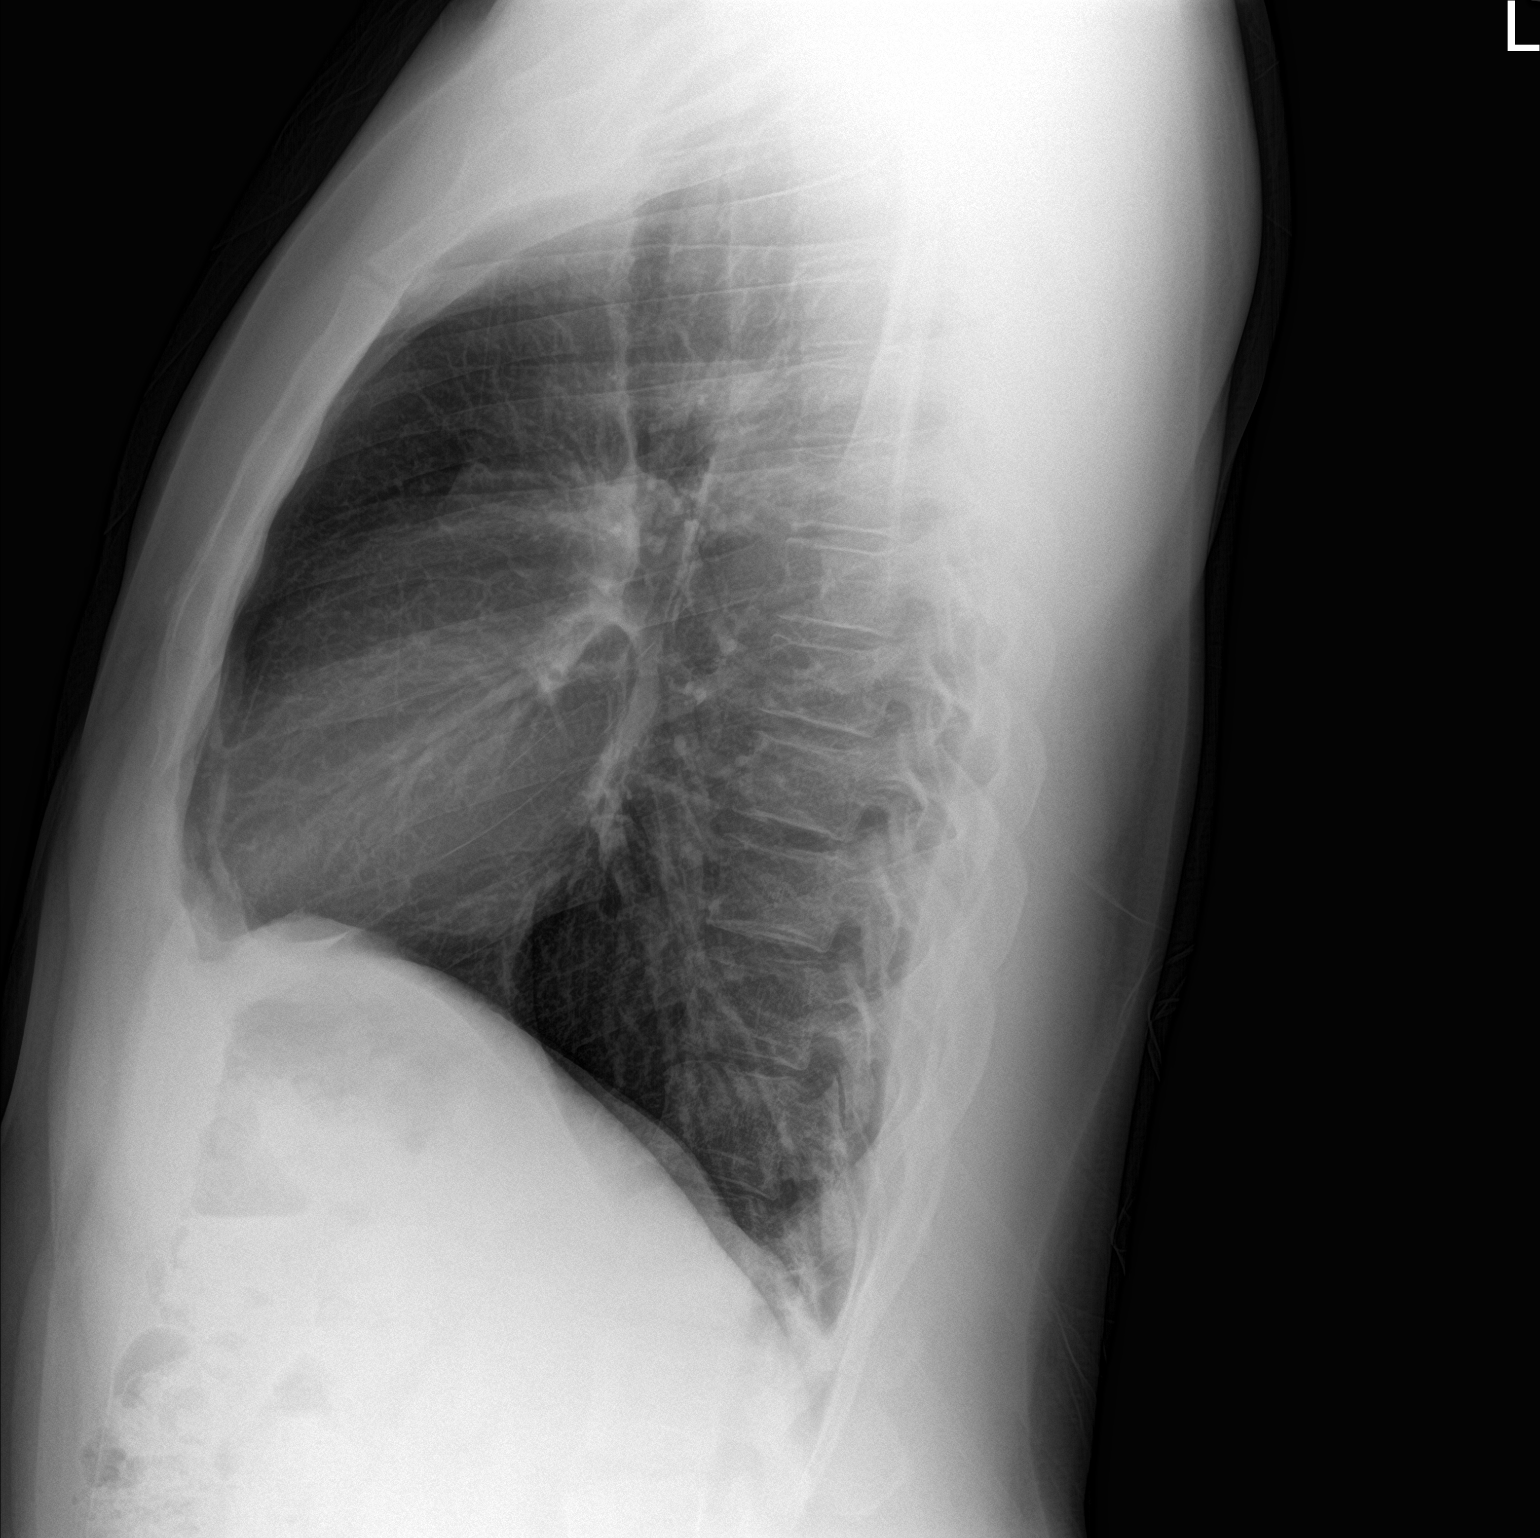

[2 of 2 positions shown; findings below may reference images not displayed]

FINDINGS: The lungs are clear. Heart size and pulmonary vascularity are
normal. No adenopathy. No pneumothorax. No bone lesions.
IMPRESSION: No edema or consolidation.

## 2019-06-28 MED ORDER — POTASSIUM CHLORIDE CRYS ER 20 MEQ PO TBCR
40.0000 meq | EXTENDED_RELEASE_TABLET | Freq: Once | ORAL | Status: AC
  Administered 2019-06-28: 13:00:00 40 meq via ORAL
  Filled 2019-06-28: qty 2

## 2019-06-28 MED ORDER — SODIUM CHLORIDE 0.9% FLUSH
3.0000 mL | Freq: Once | INTRAVENOUS | Status: DC
  Filled 2019-06-28: qty 3

## 2019-06-28 MED ORDER — POTASSIUM CHLORIDE 10 MEQ/100ML IV SOLN
10.0000 meq | INTRAVENOUS | Status: AC
  Administered 2019-06-28 (×2): 10 meq via INTRAVENOUS
  Filled 2019-06-28 (×2): qty 100

## 2019-06-28 MED ORDER — POTASSIUM CHLORIDE CRYS ER 20 MEQ PO TBCR: 20.0000 meq | EXTENDED_RELEASE_TABLET | Freq: Two times a day (BID) | ORAL | 0 refills | Status: DC

## 2019-06-28 MED ORDER — SODIUM CHLORIDE 0.9 % IV SOLN
INTRAVENOUS | Status: DC | PRN
  Administered 2019-06-28 (×2): 500 mL via INTRAVENOUS

## 2019-06-28 NOTE — Discharge Instructions (Signed)
Your work-up today was overall reassuring aside from a low potassium and a slightly elevated troponin as we discussed.  No EKG did not show heart attack and your chest x-ray was reassuring.  The other labs were reassuring and your d-dimer was negative showing no blood clot.  I spoke with cardiology who agreed to see you in clinic next week and to provide you the prescription for the potassium pills.  Please follow-up in clinic with him and if any symptoms change or worsen, please return to the nearest emergency department immediately.

## 2019-06-28 NOTE — ED Triage Notes (Signed)
Pt presents with c/o chest pain for 6 days and was seen at urgent care. Pt reports a stabbing pain in left chest area.

## 2019-06-28 NOTE — ED Notes (Signed)
ED Provider at bedside. 

## 2019-06-28 NOTE — ED Provider Notes (Signed)
[facility] EMERGENCY DEPARTMENT Provider Note   CSN: [id number] Arrival date & time: 06/28/19  1101     History   Chief Complaint Chief Complaint  Patient presents with   Chest Pain    HPI Jason Sampson is a 38 y.o. male.     The history is provided by the patient and medical records. No language interpreter was used.  Chest Pain Pain location:  L chest and L lateral chest Pain quality: aching and sharp   Pain radiates to:  L shoulder and L arm Pain severity:  Mild Onset quality:  Sudden Duration:  6 days Timing:  Sporadic Progression:  Resolved Chronicity:  New Context: not movement, not raising an arm and not trauma   Relieved by:  Nothing Worsened by:  Nothing Ineffective treatments:  None tried Associated symptoms: no abdominal pain, no altered mental status, no back pain, no cough, no diaphoresis, no dysphagia, no fatigue, no fever, no headache, no heartburn, no lower extremity edema, no nausea, no near-syncope, no numbness, no shortness of breath, no syncope, no vomiting and no weakness   Risk factors: hypertension and male sex     Past Medical History:  Diagnosis Date   Asthma    GERD (gastroesophageal reflux disease)    Hypercholesterolemia    PURE   Hypertension    Palpitations    Seasonal allergies     Patient Active Problem List   Diagnosis Date Noted   Palpitations 12/07/2018   PVC's (premature ventricular contractions) 12/07/2018   Neck pain 02/05/2018   Acute seasonal allergic rhinitis 09/01/2016   Essential hypertension 11/14/2014   Complication of internal fixation device (HCC) 09/09/2013   Pain in left foot 09/09/2013   Status post foot surgery 04/26/2013   Plantar fasciitis of left foot 03/30/2013   Metatarsal deformity 03/30/2013    Past Surgical History:  Procedure Laterality Date   FOOT SURGERY     Lapidus Fusion Left 04/21/2013   @[facility]         Home Medications    Prior to Admission  medications   Medication Sig Start Date End Date Taking? Authorizing Provider  amLODipine (NORVASC) 10 MG tablet Take 10 mg by mouth daily.     [provider]  aspirin EC 81 MG tablet Take 81 mg by mouth daily.      [provider]  atorvastatin (LIPITOR) 10 MG tablet Take 10 mg by mouth daily. 08/18/18   [provider]  cetirizine (ZYRTEC) 10 MG tablet Take 10 mg by mouth daily.    [provider]  chlorproMAZINE (THORAZINE) 25 MG tablet Take 1 tablet (25 mg total) by mouth 3 (three) times daily as needed for hiccoughs. 12/11/18   12/13/18, MD  chlorthalidone (HYGROTON) 25 MG tablet Take 25 mg by mouth daily.    [provider]  fluticasone (FLONASE) 50 MCG/ACT nasal spray Place 1 spray into both nostrils daily as needed for allergies.     [provider]  omeprazole (PRILOSEC) 40 MG capsule Take 1 capsule (40 mg total) by mouth 2 (two) times daily. 12/15/18   12/17/18, MD  simethicone (MYLICON) 125 MG chewable tablet Chew 125 mg by mouth every 6 (six) hours as needed for flatulence.    [provider]    Family History Family History  Problem Relation Age of Onset   Lupus Mother    Sarcoidosis Mother    Hypertension Mother    CVA Mother    Atrial fibrillation  Father    Hypertension Father    Migraines Sister    Hypothyroidism Sister     Social History Social History   Tobacco Use   Smoking status: Never Smoker   Smokeless tobacco: Never Used  Substance Use Topics   Alcohol use: No   Drug use: No     Allergies   Patient has no known allergies.   Review of Systems Review of Systems  Constitutional: Negative for chills, diaphoresis, fatigue and fever.  HENT: Negative for congestion and trouble swallowing.   Respiratory: Negative for cough, choking, chest tightness, shortness of breath and wheezing.   Cardiovascular: Positive for chest pain. Negative for leg swelling, syncope and  near-syncope.  Gastrointestinal: Negative for abdominal pain, constipation, diarrhea, heartburn, nausea and vomiting.  Genitourinary: Negative for flank pain and frequency.  Musculoskeletal: Negative for back pain, neck pain and neck stiffness.  Skin: Negative for rash and wound.  Neurological: Negative for weakness, light-headedness, numbness and headaches.  Psychiatric/Behavioral: Negative for agitation.  All other systems reviewed and are negative.    Physical Exam Updated Vital Signs BP (!) 147/95 (BP Location: Right Arm)    Pulse 60    Temp 98.8 F (37.1 C) (Oral)    Resp 16    Ht 5' 7.5" (1.715 m)    Wt 94.8 kg    SpO2 99%    BMI 32.25 kg/m   Physical Exam Vitals signs and nursing note reviewed.  Constitutional:      General: He is not in acute distress.    Appearance: Normal appearance. He is not ill-appearing, toxic-appearing or diaphoretic.  HENT:     Head: Normocephalic and atraumatic.     Nose: Nose normal. No congestion or rhinorrhea.     Mouth/Throat:     Mouth: Mucous membranes are moist.     Pharynx: No oropharyngeal exudate or posterior oropharyngeal erythema.  Eyes:     Conjunctiva/sclera: Conjunctivae normal.     Pupils: Pupils are equal, round, and reactive to light.  Neck:     Musculoskeletal: No muscular tenderness.  Cardiovascular:     Rate and Rhythm: Normal rate.     Pulses: Normal pulses.     Heart sounds: No murmur.  Pulmonary:     Effort: Pulmonary effort is normal.     Breath sounds: No wheezing, rhonchi or rales.  Chest:     Chest wall: No tenderness.  Abdominal:     General: Abdomen is flat. There is no distension.     Tenderness: There is no abdominal tenderness.  Musculoskeletal:        General: No tenderness.     Right lower leg: No edema.     Left lower leg: No edema.  Skin:    Capillary Refill: Capillary refill takes less than 2 seconds.     Findings: No erythema.  Neurological:     General: No focal deficit present.     Mental  Status: He is alert and oriented to person, place, and time.     Cranial Nerves: No cranial nerve deficit.  Psychiatric:        Mood and Affect: Mood normal.      ED Treatments / Results  Labs (all labs ordered are listed, but only abnormal results are displayed) Labs Reviewed  BASIC METABOLIC PANEL - Abnormal; Notable for the following components:      Result Value   Potassium 2.7 (*)    Chloride 97 (*)    All other components within  normal limits  TROPONIN I (HIGH SENSITIVITY) - Abnormal; Notable for the following components:   Troponin I (High Sensitivity) 22 (*)    All other components within normal limits  TROPONIN I (HIGH SENSITIVITY) - Abnormal; Notable for the following components:   Troponin I (High Sensitivity) 22 (*)    All other components within normal limits  CBC  D-DIMER, QUANTITATIVE (NOT AT [facility])    EKG EKG Interpretation  Date/Time:  Tuesday June 28 2019 11:09:33 EDT Ventricular Rate:  75 PR Interval:  150 QRS Duration: 92 QT Interval:  380 QTC Calculation: 424 R Axis:   69 Text Interpretation:  Normal sinus rhythm T wave abnormality, consider inferior ischemia Abnormal ECG When compared to prior, new twve inversionin lead V3 and V4.  No STEMI Confirmed by Antony Blackbird [phone]) on 06/28/2019 12:23:05 PM   Radiology Dg Chest 2 View  Result Date: 06/28/2019 CLINICAL DATA:  Chest pain EXAM: CHEST - 2 VIEW COMPARISON:  December 09, 2018 FINDINGS: The lungs are clear. Heart size and pulmonary vascularity are normal. No adenopathy. No pneumothorax. No bone lesions. IMPRESSION: No edema or consolidation. Electronically Signed   By: Lowella Grip III M.D.   On: 06/28/2019 11:24    Procedures Procedures (including critical care time)  Medications Ordered in ED Medications  sodium chloride flush (NS) 0.9 % injection 3 mL (3 mLs Intravenous Not Given 06/28/19 1225)  potassium chloride 10 mEq in 100 mL IVPB (10 mEq Intravenous New Bag/Given 06/28/19 1535)   0.9 %  sodium chloride infusion (500 mLs Intravenous New Bag/Given 06/28/19 1532)  potassium chloride SA (KLOR-CON) CR tablet 40 mEq (40 mEq Oral Given 06/28/19 1324)     Initial Impression / Assessment and Plan / ED Course  I have reviewed the triage vital signs and the nursing notes.  Pertinent labs & imaging results that were available during my care of the patient were reviewed by me and considered in my medical decision making (see chart for details).        Jason Sampson is a 38 y.o. male with a past medical history significant for hypertension, asthma, GERD, and prior PVCs who presents with chest pain.  He reports that for the last several days he has been having intermittent sharp chest pain in his left chest.  He reports there is an aching component to it radiate into his left arm that has resolved.  But he does feel the sharp jolts in the left chest at times.  He reports it has been going on for the last several days.  He denies any trauma.  Denies nausea, vomiting, diaphoresis, lightheadedness, or syncope.  He has never had this before by his report.  Denies recent long travel, leg pain, leg swelling, or history of DVT or PE.  No immediate family history of early heart disease.  He does report that he has had low potassium in the past related to blood pressure medications.  On arrival, EKG shows T wave inversions compared to prior but no STEMI.  No arrhythmia seen.  On exam, chest is nontender.  Lungs clear.  No murmur.  Abdomen nontender.  Good pulses in all extremities.  No leg pain or leg swelling.  Aside from mild hypertension, no significant abnormalities on vital signs.  Heart score calculated as a 2.  Patient work-up including a d-dimer, troponin, imaging, and labs.  Initial troponin elevated at 22.  Delta troponin was unchanged at 22.  Unclear if this is baseline.  Potassium was  found to be low at 2.7.  He was given both oral and IV potassium and will be monitored on  telemetry.  Chest x-ray reassuring and other labs showed negative d-dimer and normal kidney function.  No anemia.  Clinically I suspect patient may be having PVCs from his hypokalemia contributing to the sharp chest discomfort that is brief.  Due to the elevated troponin, will touch base with cardiology however anticipate discharge with outpatient cardiology follow-up.  Patient will be given prescription for oral potassium after he completes his IV potassium if he is stable for discharge home.  Jason Sampson with cardiology who agrees the patient is likely safe for discharge home.  He does want to see patient in clinic next week and agreed with oral potassium supplementation at discharge.  Strict return precautions were given and understood and patient agrees with going home.  He will be discharged after his potassium is completed.    Final Clinical Impressions(s) / ED Diagnoses   Final diagnoses:  Precordial pain  Elevated troponin  Hypokalemia    ED Discharge Orders         Ordered    potassium chloride SA (KLOR-CON) 20 MEQ tablet  2 times daily     06/28/19 1600          Clinical Impression: 1. Precordial pain   2. Elevated troponin   3. Hypokalemia     Disposition: Discharge  Condition: Good  I have discussed the results, Dx and Tx plan with the pt(& family if present). He/she/they expressed understanding and agree(s) with the plan. Discharge instructions discussed at great length. Strict return precautions discussed and pt &/or family have verbalized understanding of the instructions. No further questions at time of discharge.    New Prescriptions   POTASSIUM CHLORIDE SA (KLOR-CON) 20 MEQ TABLET    Take 1 tablet (20 mEq total) by mouth 2 (two) times daily.    Follow Up: Jason Sampson, [place], MD [address] [place] [id number] [phone]        Jason Sampson, Canary Brimhristopher J, MD 06/28/19 [phone]

## 2019-07-05 NOTE — Progress Notes (Signed)
Cardiology Office Note:   Date:  07/06/2019  NAME:  Jason Sampson Latner    MRN: [id number] DOB:  12/02/1980   PCP:  Renford DillsPolite, Ronald, MD  Cardiologist:  Reatha HarpsWesley T O'Neal, MD  Electrophysiologist:  None   Referring MD: Renford DillsPolite, Ronald, MD   Chief Complaint  Patient presents with   Chest Pain    History of Present Illness:   Jason Sampson Hellinger is a 38 y.o. male with a hx of asthma, GERD, hypertension who is being seen today for the evaluation of chest pain/palpitations at the request of Renford DillsPolite, Ronald, MD.    He was evaluated in the emergency room on 06/28/2019 for chest pain and palpitations.  He was found to have frequent PVCs on a monitor.  His potassium was noted to be 2.7 and this was repleted.  His EKG at that time demonstrated normal sinus rhythm with LVH and repolarization abnormality.  He was noted to have mildly elevated high-sensitivity troponin elevation that was flat on repeat (22-22).  Of note he had a Holter monitor performed in 2019 that showed occasional PVCs and PACs.  There were no sustained arrhythmias.  He reports since leaving the emergency room he has been better on potassium supplementation.  He was noted to have frequent PVCs during that emergency room visit, and his potassium was supplemented.  He reports he has had 2 episodes of chest pain similar to what brought him to the emergency room.  The pain is described as sharp pain in his chest that can occur at any time, lasts up to 45 seconds, and resolved without any intervention.  He reports the pain he had that took him to the emergency room lasted 1 week, and just got to a point where he needed to be evaluated.  He reports no trouble breathing with this.  He has no physical limitations that I can tell.  He is a non-smoker.  He does have a longstanding history of high blood pressure, nearly 17 years.  No direct evidence of cardiovascular disease in the family, but family members do have high blood pressure.  He reports the pain  he had since leaving the emergency room has resolved.  But he did have this pain for about a week.  Previously, he does report having an episode of similar chest pain a few months prior that was attributed to GERD.  He has remained on amlodipine and chlorthalidone for a number of years.  Blood pressure is well controlled today.  He is on Lipitor.  I do not know what his cholesterol is.  Past Medical History: Past Medical History:  Diagnosis Date   Asthma    GERD (gastroesophageal reflux disease)    Hypercholesterolemia    PURE   Hypertension    Palpitations    Seasonal allergies     Past Surgical History: Past Surgical History:  Procedure Laterality Date   FOOT SURGERY     Lapidus Fusion Left 04/21/2013   @[facility]     Current Medications: Current Meds  Medication Sig   amLODipine (NORVASC) 10 MG tablet Take 10 mg by mouth daily.    aspirin EC 81 MG tablet Take 81 mg by mouth daily.     atorvastatin (LIPITOR) 10 MG tablet Take 10 mg by mouth daily.   cetirizine (ZYRTEC) 10 MG tablet Take 10 mg by mouth daily.   chlorthalidone (HYGROTON) 25 MG tablet Take 25 mg by mouth daily.   fluticasone (FLONASE) 50 MCG/ACT nasal spray Place 1 spray into both nostrils  daily as needed for allergies.    potassium chloride SA (KLOR-CON) 20 MEQ tablet Take 1 tablet (20 mEq total) by mouth 2 (two) times daily.   [DISCONTINUED] chlorproMAZINE (THORAZINE) 25 MG tablet Take 1 tablet (25 mg total) by mouth 3 (three) times daily as needed for hiccoughs.   [DISCONTINUED] omeprazole (PRILOSEC) 40 MG capsule Take 1 capsule (40 mg total) by mouth 2 (two) times daily.   [DISCONTINUED] simethicone (MYLICON) 125 MG chewable tablet Chew 125 mg by mouth every 6 (six) hours as needed for flatulence.     Allergies:    Patient has no known allergies.   Social History: Social History   Socioeconomic History   Marital status: Married    Spouse name: Not on file   Number of children: 4   Years  of education: Not on file   Highest education level: Not on file  Occupational History   Not on file  Social Needs   Financial resource strain: Not on file   Food insecurity    Worry: Not on file    Inability: Not on file   Transportation needs    Medical: Not on file    Non-medical: Not on file  Tobacco Use   Smoking status: Never Smoker   Smokeless tobacco: Never Used  Substance and Sexual Activity   Alcohol use: No   Drug use: No   Sexual activity: Not on file  Lifestyle   Physical activity    Days per week: Not on file    Minutes per session: Not on file   Stress: Not on file  Relationships   Social connections    Talks on phone: Not on file    Gets together: Not on file    Attends religious service: Not on file    Active member of club or organization: Not on file    Attends meetings of clubs or organizations: Not on file    Relationship status: Not on file  Other Topics Concern   Not on file  Social History Narrative   Not on file     Family History: The patient's family history includes Atrial fibrillation in his father; CVA in his mother; Hypertension in his father and mother; Hypothyroidism in his sister; Lupus in his mother; Migraines in his sister; Sarcoidosis in his mother.  ROS:   All other ROS reviewed and negative. Pertinent positives noted in the HPI.     EKGs/Labs/Other Studies Reviewed:   The following studies were personally reviewed by me today:  EKG:  EKG is ordered today.  The ekg ordered today demonstrates normal sinus rhythm, heart rate 67, nonspecific T wave abnormalities noted, no prior infarction, and was personally reviewed by me.   Recent Labs: 06/28/2019: BUN 15; Creatinine, Ser 1.02; Hemoglobin 15.9; Platelets 251; Potassium 2.7; Sodium 137   Recent Lipid Panel No results found for: CHOL, TRIG, HDL, CHOLHDL, VLDL, LDLCALC, LDLDIRECT  Physical Exam:   VS:  BP 130/78    Pulse 77    Temp (!) 97.2 F (36.2 C)    Ht 5'  7.5" (1.715 m)    Wt 209 lb 6.4 oz (95 kg)    SpO2 98%    BMI 32.31 kg/m    Wt Readings from Last 3 Encounters:  07/06/19 209 lb 6.4 oz (95 kg)  06/28/19 209 lb (94.8 kg)  12/15/18 204 lb (92.5 kg)    General: Well nourished, well developed, in no acute distress Heart: Atraumatic, normal size  Eyes: PEERLA,  EOMI  Neck: Supple, no JVD Endocrine: No thryomegaly Cardiac: Normal S1, S2; RRR; no murmurs, rubs, or gallops Lungs: Clear to auscultation bilaterally, no wheezing, rhonchi or rales  Abd: Soft, nontender, no hepatomegaly  Ext: No edema, pulses 2+ Musculoskeletal: No deformities, BUE and BLE strength normal and equal Skin: Warm and dry, no rashes   Neuro: Alert and oriented to person, place, time, and situation, CNII-XII grossly intact, no focal deficits  Psych: Normal mood and affect   ASSESSMENT:   NAME@ is a 38 y.o. male who presents for the following: 1. Precordial pain   2. Palpitations   3. Essential hypertension   4. PVC's (premature ventricular contractions)   5. Hypokalemia     PLAN:   1. Precordial pain 2. Palpitations -He presents for the evaluation of 2 episodes of sharp atypical chest pain.  His EKG demonstrates T wave inversions.  In the emergency room he was noted to have similar EKG changes, and high-sensitivity troponin values were 222 (this is not entirely normal).  He has no prior history of CAD.  He has a longstanding history of hypertension.  He is a non-smoker.  Overall, I think his pain is likely related to the PVCs he had in the setting of hypokalemia.  However given his significant CAD risk factors I think we should proceed with a coronary CTA and echocardiogram to exclude any structural heart disease.  Should these come back normal, I think we will likely switch his chlorthalidone as this likely causes hypokalemia and PVCs. -He will take 100 mg of metoprolol tartrate 1 hour before scan. -We will see him back in 3 months after his scan -We will also  check a BMP and lipid profile at the same time 1 week before scan  3. Essential hypertension -At goal today -I suspect I will stop his chlorthalidone at our next visit if the above work-up is negative  4. PVC's (premature ventricular contractions) 5. Hypokalemia -His most recent episodes of PVCs were likely related to hypokalemia in the setting of chlorthalidone use.  Much better on potassium supplementation. -We will continue this for now and I will check his potassium on a repeat BMP -If the above work-up is negative, I likely will stop his chlorthalidone and switch him to an ACE/ARB  Disposition: Return in about 3 months (around 10/06/2019).  Medication Adjustments/Labs and Tests Ordered: Current medicines are reviewed at length with the patient today.  Concerns regarding medicines are outlined above.  Orders Placed This Encounter  Procedures   CT CORONARY MORPH W/CTA COR W/SCORE W/CA W/CM &/OR WO/CM   CT CORONARY FRACTIONAL FLOW RESERVE DATA PREP   CT CORONARY FRACTIONAL FLOW RESERVE FLUID ANALYSIS   Basic metabolic panel   Lipid panel   EKG 12-Lead   ECHOCARDIOGRAM COMPLETE   Meds ordered this encounter  Medications   metoprolol tartrate (LOPRESSOR) 100 MG tablet    Sig: Take 1 tablet (100 mg total) by mouth once for 1 dose. Take tablet one hour prior to Cardiac CTA.    Dispense:  1 tablet    Refill:  0    Patient Instructions  Lab work: Your physician recommends that you return for a FASTING lipid profile and BMET to check cholesterol and kidney function within one week of scheduled CTA.  If you have labs (blood work) drawn today and your tests are completely normal, you will receive your results only by:  [place] (if you have MyChart) OR  A paper copy in the  mail If you have any lab test that is abnormal or we need to change your treatment, we will call you to review the results.  Testing/Procedures: Your cardiac CT will be scheduled at one of the  below locations:   [facility] [address] [place], [place] [address] [phone]  If scheduled at [facility], please arrive at the [facility] main entrance of [facility] 30-45 minutes prior to test start time. Proceed to the [facility] Radiology Department (first floor) to check-in and test prep.  Please follow these instructions carefully (unless otherwise directed):  On the Night Before the Test:  Be sure to Drink plenty of water.  Do not consume any caffeinated/decaffeinated beverages or chocolate 12 hours prior to your test.  Do not take any antihistamines 12 hours prior to your test.  On the Day of the Test:  Drink plenty of water. Do not drink any water within one hour of the test.  Do not eat any food 4 hours prior to the test.  You may take your regular medications prior to the test.   Take metoprolol (Lopressor) two hours prior to test.    After the Test:  Drink plenty of water.  After receiving IV contrast, you may experience a mild flushed feeling. This is normal.  On occasion, you may experience a mild rash up to 24 hours after the test. This is not dangerous. If this occurs, you can take Benadryl 25 mg and increase your fluid intake.  If you experience trouble breathing, this can be serious. If it is severe call 911 IMMEDIATELY. If it is mild, please call our office.  If you take any of these medications: Glipizide/Metformin, Avandament, Glucavance, please do not take 48 hours after completing test unless otherwise instructed.  Please contact the cardiac imaging nurse navigator should you have any questions/concerns Rockwell Alexandria, RN Navigator Cardiac Imaging [facility] [phone] Office  [phone] Cell    Follow-Up: At [facility], you and your health needs are our priority.  As part of our continuing mission to provide you with exceptional heart care, we have created designated  Provider Care Teams.  These Care Teams include your primary Cardiologist (physician) and Advanced Practice Providers (APPs -  Physician Assistants and Nurse Practitioners) who all work together to provide you with the care you need, when you need it.  Please schedule a follow-up appointment to see Dr. Flora Lipps in 3 months.  Any Other Special Instructions Will Be Listed Below (If Applicable).  Other Testing: Your physician has requested that you have an echocardiogram. Echocardiography is a painless test that uses sound waves to create images of your heart. It provides your doctor with information about the size and shape of your heart and how well your hearts chambers and valves are working. This procedure takes approximately one hour. There are no restrictions for this procedure.        Signed, Lenna Gilford. Flora Lipps, MD [facility]  [address] [place], [place] [address] [phone]  07/06/2019 12:27 PM

## 2019-07-06 ENCOUNTER — Ambulatory Visit: Payer: 59 | Admitting: Cardiovascular Disease

## 2019-07-06 ENCOUNTER — Other Ambulatory Visit: Payer: Self-pay

## 2019-07-06 ENCOUNTER — Encounter: Payer: Self-pay | Admitting: Cardiovascular Disease

## 2019-07-06 VITALS — BP 130/78 | HR 77 | Temp 97.2°F | Ht 67.5 in | Wt 209.4 lb

## 2019-07-06 DIAGNOSIS — R072 Precordial pain: Secondary | ICD-10-CM | POA: Diagnosis not present

## 2019-07-06 DIAGNOSIS — I10 Essential (primary) hypertension: Secondary | ICD-10-CM | POA: Diagnosis not present

## 2019-07-06 DIAGNOSIS — I493 Ventricular premature depolarization: Secondary | ICD-10-CM | POA: Diagnosis not present

## 2019-07-06 DIAGNOSIS — E876 Hypokalemia: Secondary | ICD-10-CM

## 2019-07-06 DIAGNOSIS — R002 Palpitations: Secondary | ICD-10-CM

## 2019-07-06 MED ORDER — METOPROLOL TARTRATE 100 MG PO TABS: 100.0000 mg | ORAL_TABLET | Freq: Once | ORAL | 0 refills | Status: DC

## 2019-07-06 NOTE — Patient Instructions (Signed)
Lab work: Your physician recommends that you return for a FASTING lipid profile and BMET to check cholesterol and kidney function within one week of scheduled CTA.  If you have labs (blood work) drawn today and your tests are completely normal, you will receive your results only by: [facility] Kitchen MyChart Message (if you have MyChart) OR . A paper copy in the mail If you have any lab test that is abnormal or we need to change your treatment, we will call you to review the results.  Testing/Procedures: Your cardiac CT will be scheduled at one of the below locations:   [facility] [address] [place], [place] [address] [phone]  If scheduled at [facility], please arrive at the [facility] main entrance of [facility] 30-45 minutes prior to test start time. Proceed to the [facility] Radiology Department (first floor) to check-in and test prep.  Please follow these instructions carefully (unless otherwise directed):  On the Night Before the Test: . Be sure to Drink plenty of water. . Do not consume any caffeinated/decaffeinated beverages or chocolate 12 hours prior to your test. . Do not take any antihistamines 12 hours prior to your test.  On the Day of the Test: . Drink plenty of water. Do not drink any water within one hour of the test. . Do not eat any food 4 hours prior to the test. . You may take your regular medications prior to the test.  . Take metoprolol (Lopressor) two hours prior to test.    After the Test: . Drink plenty of water. . After receiving IV contrast, you may experience a mild flushed feeling. This is normal. . On occasion, you may experience a mild rash up to 24 hours after the test. This is not dangerous. If this occurs, you can take Benadryl 25 mg and increase your fluid intake. . If you experience trouble breathing, this can be serious. If it is severe call 911 IMMEDIATELY. If it is mild, please call our office. . If you take any of  these medications: Glipizide/Metformin, Avandament, Glucavance, please do not take 48 hours after completing test unless otherwise instructed.  Please contact the cardiac imaging nurse navigator should you have any questions/concerns Marchia Bond, RN Navigator Cardiac Imaging [facility] [phone] Office  [phone] Cell    Follow-Up: At [facility], you and your health needs are our priority.  As part of our continuing mission to provide you with exceptional heart care, we have created designated Provider Care Teams.  These Care Teams include your primary Cardiologist (physician) and Advanced Practice Providers (APPs -  Physician Assistants and Nurse Practitioners) who all work together to provide you with the care you need, when you need it. . Please schedule a follow-up appointment to see Dr. Audie Box in 3 months.  Any Other Special Instructions Will Be Listed Below (If Applicable).  Other Testing: Your physician has requested that you have an echocardiogram. Echocardiography is a painless test that uses sound waves to create images of your heart. It provides your doctor with information about the size and shape of your heart and how well your heart's chambers and valves are working. This procedure takes approximately one hour. There are no restrictions for this procedure.

## 2019-07-11 ENCOUNTER — Ambulatory Visit (HOSPITAL_COMMUNITY): Payer: 59 | Attending: Cardiology

## 2019-07-11 ENCOUNTER — Other Ambulatory Visit: Payer: Self-pay

## 2019-07-11 DIAGNOSIS — R002 Palpitations: Secondary | ICD-10-CM | POA: Diagnosis present

## 2019-07-11 DIAGNOSIS — R072 Precordial pain: Secondary | ICD-10-CM | POA: Diagnosis present

## 2019-07-11 DIAGNOSIS — I10 Essential (primary) hypertension: Secondary | ICD-10-CM | POA: Insufficient documentation

## 2019-07-14 ENCOUNTER — Ambulatory Visit: Payer: Self-pay | Admitting: Cardiology

## 2019-08-24 LAB — LIPID PANEL
Chol/HDL Ratio: 4.3 ratio (ref 0.0–5.0)
Cholesterol, Total: 187 mg/dL (ref 100–199)
HDL: 44 mg/dL (ref >39)
LDL Chol Calc (NIH): 120 mg/dL — ABNORMAL HIGH (ref 0–99)
Triglycerides: 127 mg/dL (ref 0–149)
VLDL Cholesterol Cal: 23 mg/dL (ref 5–40)

## 2019-08-24 LAB — BASIC METABOLIC PANEL
BUN/Creatinine Ratio: 12 (ref 9–20)
BUN: 11 mg/dL (ref 6–20)
CO2: 25 mmol/L (ref 20–29)
Calcium: 9.7 mg/dL (ref 8.7–10.2)
Chloride: 101 mmol/L (ref 96–106)
Creatinine, Ser: 0.94 mg/dL (ref 0.76–1.27)
GFR calc Af Amer: 119 mL/min/{1.73_m2} (ref >59)
GFR calc non Af Amer: 103 mL/min/{1.73_m2} (ref >59)
Glucose: 80 mg/dL (ref 65–99)
Potassium: 4.3 mmol/L (ref 3.5–5.2)
Sodium: 140 mmol/L (ref 134–144)

## 2019-08-26 ENCOUNTER — Other Ambulatory Visit: Payer: Self-pay

## 2019-08-26 ENCOUNTER — Ambulatory Visit (HOSPITAL_COMMUNITY)
Admission: RE | Admit: 2019-08-26 | Discharge: 2019-08-26 | Disposition: A | Discharge status: Home / Self Care | Payer: 59 | Source: Ambulatory Visit | Attending: Cardiovascular Disease | Admitting: Cardiovascular Disease

## 2019-08-26 DIAGNOSIS — I10 Essential (primary) hypertension: Secondary | ICD-10-CM | POA: Insufficient documentation

## 2019-08-26 DIAGNOSIS — R002 Palpitations: Secondary | ICD-10-CM | POA: Diagnosis present

## 2019-08-26 DIAGNOSIS — R072 Precordial pain: Secondary | ICD-10-CM | POA: Diagnosis present

## 2019-08-26 IMAGING — CT CT HEART MORP W/ CTA COR W/ SCORE W/ CA W/CM &/OR W/O CM
4 of 7 series · 8 of 20 positions shown, 9 images · non-contrast
Comparison: None.
COMPARISON: None.

Addendum:
EXAM:
OVER-READ INTERPRETATION  CT CHEST

The following report is an over-read performed by radiologist Dr.
AMAZIGH [REDACTED] on [DATE]. This
over-read does not include interpretation of cardiac or coronary
anatomy or pathology. The coronary CTA interpretation by the
cardiologist is attached.
CLINICAL DATA: Chest pain
Cardiac/Coronary CTA
TECHNIQUE: The patient was scanned on a Phillips Force scanner. A 100 kV
prospective scan was triggered in the descending thoracic aorta at
111 HU's. Axial non-contrast 3 mm slices were carried out through
the heart. The data set was analyzed on a dedicated work station and
scored using the Agatson method. Gantry rotation speed was 250 msecs
and collimation was .6 mm. No beta blockade and 0.8 mg of sl NTG was
given. The 3D data set was reconstructed in 5% intervals of the
35-75 % of the R-R cycle. Diastolic phases were analyzed on a
dedicated work station using MPR, MIP and VRT modes. The patient
received 80 cc of contrast.

[Series 7: best diast 46 % · axial · 0.34mm/px · z∈[+1071,+1109]mm · 2 of 288 slices shown]
[im 96/288  vessel]
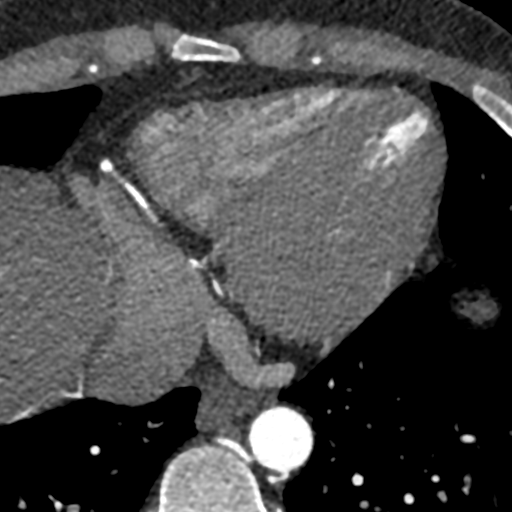
[im 192/288  vessel]
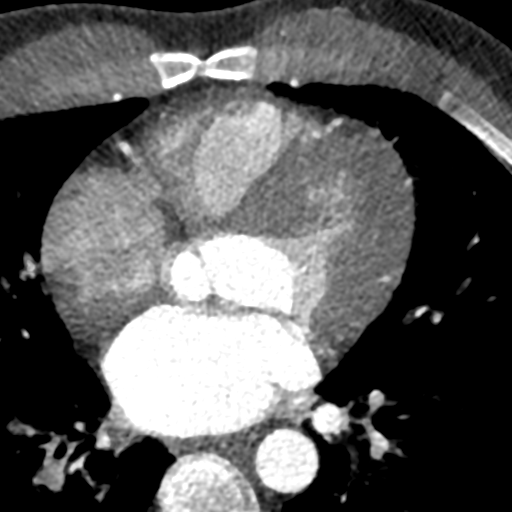

[Series 8: best syst · axial · 0.34mm/px · z∈[+1071,+1109]mm · 2 of 288 slices shown, 3 images]
[im 96/288  vessel]
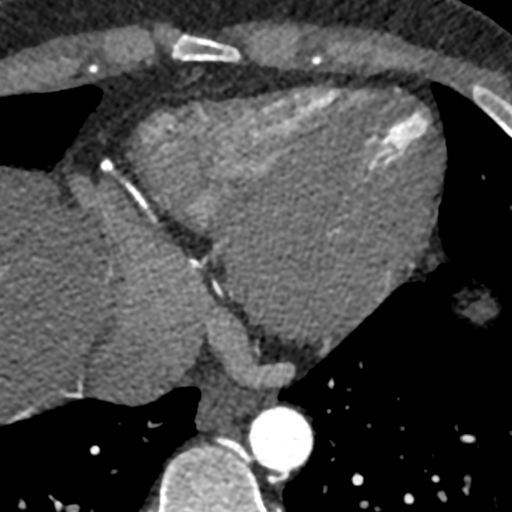
[im 96/288  lung]
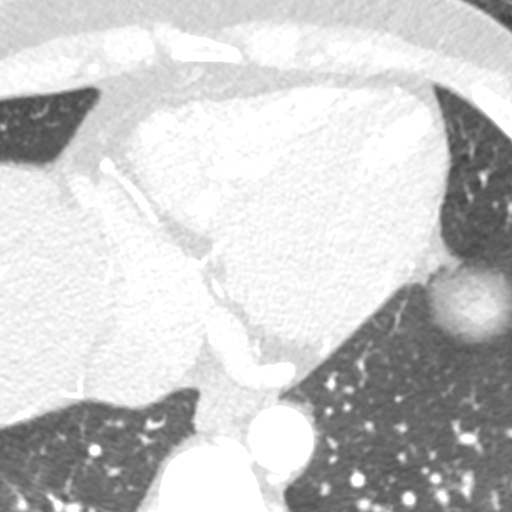
[im 192/288  vessel]
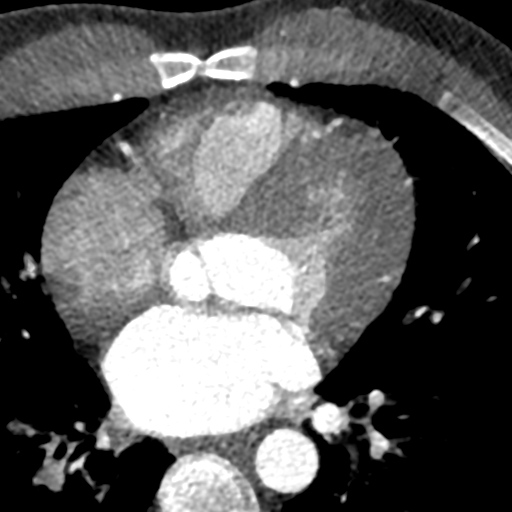

[Series 9: ts diast sharp 46 % · axial · 0.34mm/px · z∈[+1071,+1109]mm · 2 of 288 slices shown]
[im 96/288  lung]
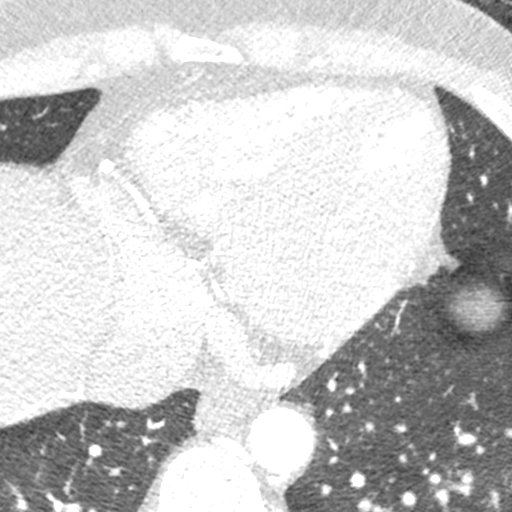
[im 192/288  lung]
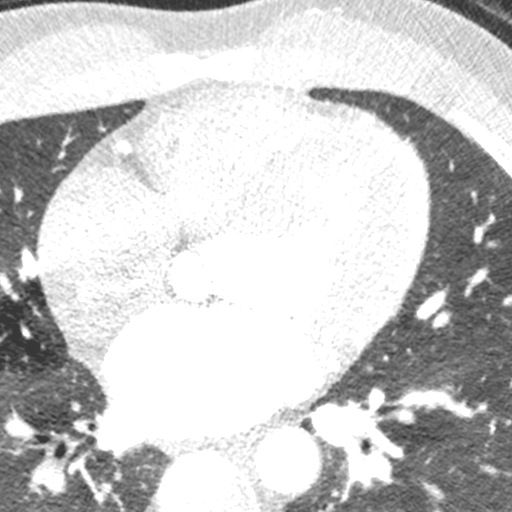

[Series 10: ts syst sharp · axial · 0.34mm/px · z∈[+1071,+1109]mm · 2 of 288 slices shown]
[im 96/288  lung]
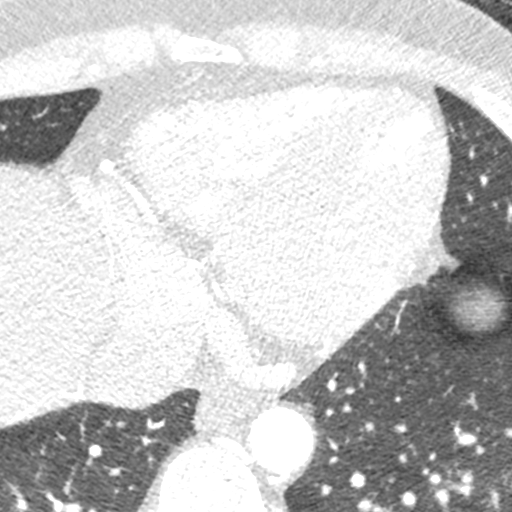
[im 192/288  lung]
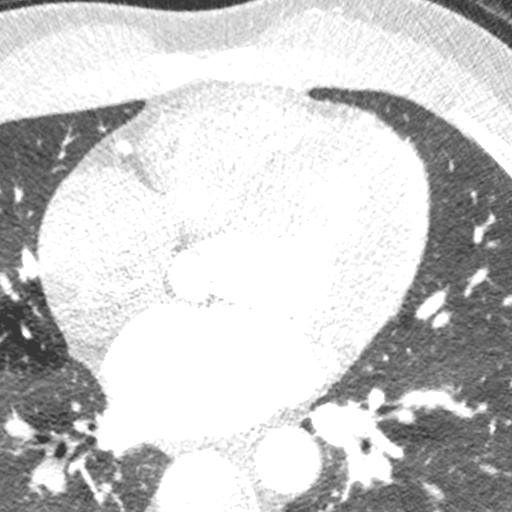

[8 of 20 positions shown; findings below may reference images not displayed]

FINDINGS: Vascular: No incidental findings.

Mediastinum/Nodes: Visualized mediastinum and hilar regions
demonstrate no lymphadenopathy or masses.

Lungs/Pleura: Visualized lungs no evidence of pulmonary edema,
consolidation, pneumothorax, nodule or pleural fluid.

Upper Abdomen: The visualized liver demonstrates steatosis.

Musculoskeletal: No chest wall mass or suspicious bone lesions
identified.
IMPRESSION: Hepatic steatosis.
FINDINGS: Image quality: Average.

Noise artifact is: Significant motion artifact present. The
patient's heart rate increased from the 50s to mid 80s during
contrast injection. Multiphase assessment was used.

Coronary Arteries:  Normal coronary origin.  Right dominance.

Left main: The left main is a large caliber vessel with a normal
take off from the left coronary cusp that bifurcates to form a left
anterior descending artery and a left circumflex artery. There is no
plaque or stenosis.

Left anterior descending artery: The LAD is patent without evidence
of plaque or stenosis. The LAD gives off 2 patent diagonal branches.

Left circumflex artery: The LCX is non-dominant. The proximal LCX is
patent without evidence of plaque or stenosis. The mid LCX is
limited by motion artifact, yet this segment is likely normal. The
distal LCX is patent without plaque or stenosis. The LCX gives off 2
patent obtuse marginal branches.

Right coronary artery: The RCA is dominant with normal take off from
the right coronary cusp. There is significant motion artifact of the
proximal segment, yet this segment appears normal. The mid and
distal RCA are patent without evidence of plaque or stenosis. The
RCA terminates as a PDA and right posterolateral branch without
evidence of plaque or stenosis.

Right Atrium: Right atrial size is within normal limits.

Right Ventricle: The right ventricular cavity is within normal
limits.

Left Atrium: Left atrial size is normal in size with no left atrial
appendage filling defect.

Left Ventricle: The ventricular cavity size is within normal limits.
There are no stigmata of prior infarction. There is no abnormal
filling defect.

Pulmonary arteries: Normal in size without proximal filling defect.

Pulmonary veins: Normal pulmonary venous drainage.

Pericardium: Normal thickness with no significant effusion or
calcium present.

Cardiac valves: The aortic valve is trileaflet without significant
calcification. The mitral valve is normal structure without
significant calcification.

Aorta: Normal caliber with no significant disease.

Extra-cardiac findings: See attached radiology report for
non-cardiac structures.
IMPRESSION: 1. Coronary calcium score of 0.

2. Normal coronary origin with right dominance.

3. Significant motion artifact with limitations regarding the
interpretation of the RCA/LCX as described above. Despite these
limitations, there does not appear to be any evidence of CAD.

RECOMMENDATIONS:
1. No evidence of CAD (0%). Consider non-atherosclerotic causes of
chest pain.

*** End of Addendum ***
EXAM:
OVER-READ INTERPRETATION  CT CHEST

The following report is an over-read performed by radiologist Dr.
AMAZIGH [REDACTED] on [DATE]. This
over-read does not include interpretation of cardiac or coronary
anatomy or pathology. The coronary CTA interpretation by the
cardiologist is attached.
FINDINGS: Vascular: No incidental findings.

Mediastinum/Nodes: Visualized mediastinum and hilar regions
demonstrate no lymphadenopathy or masses.

Lungs/Pleura: Visualized lungs no evidence of pulmonary edema,
consolidation, pneumothorax, nodule or pleural fluid.

Upper Abdomen: The visualized liver demonstrates steatosis.

Musculoskeletal: No chest wall mass or suspicious bone lesions
identified.
IMPRESSION: Hepatic steatosis.

## 2019-08-26 MED ORDER — IOHEXOL 350 MG/ML SOLN
100.0000 mL | Freq: Once | INTRAVENOUS | Status: AC | PRN
  Administered 2019-08-26: 10:00:00 100 mL via INTRAVENOUS

## 2019-08-26 MED ORDER — METOPROLOL TARTRATE 5 MG/5ML IV SOLN
5.0000 mg | INTRAVENOUS | Status: DC | PRN
  Administered 2019-08-26: 5 mg via INTRAVENOUS

## 2019-08-26 MED ORDER — NITROGLYCERIN 0.4 MG SL SUBL
0.8000 mg | SUBLINGUAL_TABLET | Freq: Once | SUBLINGUAL | Status: AC
  Administered 2019-08-26: 10:00:00 0.8 mg via SUBLINGUAL

## 2019-08-26 MED ORDER — NITROGLYCERIN 0.4 MG SL SUBL
SUBLINGUAL_TABLET | SUBLINGUAL | Status: AC
  Filled 2019-08-26: qty 2

## 2019-08-26 MED ORDER — METOPROLOL TARTRATE 5 MG/5ML IV SOLN
INTRAVENOUS | Status: AC
  Filled 2019-08-26: qty 5

## 2019-10-03 NOTE — Progress Notes (Deleted)
Cardiology Office Note:   Date:  10/03/2019  NAME:  Jason Sampson    MRN: [id number] DOB:  10-03-1980   PCP:  Renford Dills, MD  Cardiologist:  Reatha Harps, MD  Electrophysiologist:  None   Referring MD: Renford Dills, MD   No chief complaint on file. ***  History of Present Illness:   Jason Sampson is a 39 y.o. male with a hx of GERD, HTN, palpitations, PVCs who is being seen in follow-up for palpitations/PVCs.  He was evaluated in the emergency room on 9/29 for chest pain and palpitations.  He was noted to have frequent PVCs on monitor.  Potassium was noted to be 2.7.  He had mildly elevated high-sensitivity troponins.  He underwent an echocardiogram which showed normal LV function.  Coronary CTA also demonstrated normal coronary arteries.  Problem List 1. PVCs  -eval in ER for CP/palpitatoins 06/28/2019 with frequent PVCs in setting of hypoK -normal echo -normal CCTA 2. HTN 3. HLD (T chol 187, HDL 44, LDL 120 TG 127 07/2019)  Past Medical History: Past Medical History:  Diagnosis Date  . Asthma   . GERD (gastroesophageal reflux disease)   . Hypercholesterolemia    PURE  . Hypertension   . Palpitations   . Seasonal allergies     Past Surgical History: Past Surgical History:  Procedure Laterality Date  . FOOT SURGERY    . Lapidus Fusion Left 04/21/2013   @[facility]     Current Medications: No outpatient medications have been marked as taking for the 10/04/19 encounter (Appointment) with O'Neal, 12/02/19, MD.     Allergies:    Patient has no known allergies.   Social History: Social History   Socioeconomic History  . Marital status: Married    Spouse name: Not on file  . Number of children: 4  . Years of education: Not on file  . Highest education level: Not on file  Occupational History  . Not on file  Tobacco Use  . Smoking status: Never Smoker  . Smokeless tobacco: Never Used  Substance and Sexual Activity  . Alcohol use: No  . Drug use:  No  . Sexual activity: Not on file  Other Topics Concern  . Not on file  Social History Narrative  . Not on file   Social Determinants of Health   Financial Resource Strain:   . Difficulty of Paying Living Expenses: Not on file  Food Insecurity:   . Worried About Ronnald Ramp in the Last Year: Not on file  . Ran Out of Food in the Last Year: Not on file  Transportation Needs:   . Lack of Transportation (Medical): Not on file  . Lack of Transportation (Non-Medical): Not on file  Physical Activity:   . Days of Exercise per Week: Not on file  . Minutes of Exercise per Session: Not on file  Stress:   . Feeling of Stress : Not on file  Social Connections:   . Frequency of Communication with Friends and Family: Not on file  . Frequency of Social Gatherings with Friends and Family: Not on file  . Attends Religious Services: Not on file  . Active Member of Clubs or Organizations: Not on file  . Attends Programme researcher, broadcasting/film/video Meetings: Not on file  . Marital Status: Not on file     Family History: The patient's ***family history includes Atrial fibrillation in his father; CVA in his mother; Hypertension in his father and mother; Hypothyroidism in his sister; Lupus  in his mother; Migraines in his sister; Sarcoidosis in his mother.  ROS:   All other ROS reviewed and negative. Pertinent positives noted in the HPI.     EKGs/Labs/Other Studies Reviewed:   The following studies were personally reviewed by me today:  EKG:  EKG is *** ordered today.  The ekg ordered today demonstrates ***, and was personally reviewed by me.   Holter 09/01/2018 1.  The baseline rhythm is normal sinus with an average heart rate of 77 bpm 2.  There are occasional PVCs and PACs 3.  There are no sustained arrhythmias 4.  There is no atrial fibrillation or flutter 5.  There are no pathologic pauses greater than 3 seconds or significant bradycardic events.  TTE 07/11/2019  1. Left ventricular ejection  fraction, by visual estimation, is 55 to 60%. The left ventricle has normal function. Normal left ventricular size. There is no left ventricular hypertrophy.  2. Global right ventricle has normal systolic function.The right ventricular size is moderately enlarged. No increase in right ventricular wall thickness.  3. Left atrial size was normal.  4. Right atrial size was normal.  5. The mitral valve is normal in structure. Trace mitral valve regurgitation. No evidence of mitral stenosis.  6. The tricuspid valve is normal in structure. Tricuspid valve regurgitation was not visualized by color flow Doppler.  7. The aortic valve is normal in structure. Aortic valve regurgitation was not visualized by color flow Doppler. Structurally normal aortic valve, with no evidence of sclerosis or stenosis.  8. The pulmonic valve was normal in structure. Pulmonic valve regurgitation is not visualized by color flow Doppler.  9. The inferior vena cava is normal in size with greater than 50% respiratory variability, suggesting right atrial pressure of 3 mmHg.  CCTA 08/26/2019 IMPRESSION: 1. Coronary calcium score of 0. 2. Normal coronary origin with right dominance. 3. Significant motion artifact with limitations regarding the interpretation of the RCA/LCX as described above. Despite these limitations, there does not appear to be any evidence of CAD. RECOMMENDATIONS: 1. No evidence of CAD (0%). Consider non-atherosclerotic causes of chest pain.   Recent Labs: 06/28/2019: Hemoglobin 15.9; Platelets 251 08/24/2019: BUN 11; Creatinine, Ser 0.94; Potassium 4.3; Sodium 140   Recent Lipid Panel    Component Value Date/Time   CHOL 187 08/24/2019 0941   TRIG 127 08/24/2019 0941   HDL 44 08/24/2019 0941   CHOLHDL 4.3 08/24/2019 0941   LDLCALC 120 (H) 08/24/2019 0941    Physical Exam:   VS:  There were no vitals taken for this visit.   Wt Readings from Last 3 Encounters:  07/06/19 209 lb 6.4 oz (95 kg)   06/28/19 209 lb (94.8 kg)  12/15/18 204 lb (92.5 kg)    General: Well nourished, well developed, in no acute distress Heart: Atraumatic, normal size  Eyes: PEERLA, EOMI  Neck: Supple, no JVD Endocrine: No thryomegaly Cardiac: Normal S1, S2; RRR; no murmurs, rubs, or gallops Lungs: Clear to auscultation bilaterally, no wheezing, rhonchi or rales  Abd: Soft, nontender, no hepatomegaly  Ext: No edema, pulses 2+ Musculoskeletal: No deformities, BUE and BLE strength normal and equal Skin: Warm and dry, no rashes   Neuro: Alert and oriented to person, place, time, and situation, CNII-XII grossly intact, no focal deficits  Psych: Normal mood and affect   ASSESSMENT:   Philo Kurtz is a 39 y.o. male who presents for the following: No diagnosis found.  PLAN:   There are no diagnoses linked to this encounter.  Disposition: No follow-ups on file.  Medication Adjustments/Labs and Tests Ordered: Current medicines are reviewed at length with the patient today.  Concerns regarding medicines are outlined above.  No orders of the defined types were placed in this encounter.  No orders of the defined types were placed in this encounter.   There are no Patient Instructions on file for this visit.   Signed, Addison Naegeli. Audie Box, [place]  [address], [place], [place] [address] [phone]  10/03/2019 9:55 PM

## 2019-10-04 ENCOUNTER — Ambulatory Visit: Payer: 59 | Admitting: Cardiovascular Disease

## 2019-11-17 ENCOUNTER — Ambulatory Visit: Payer: Self-pay | Admitting: Cardiovascular Disease

## 2019-11-22 NOTE — Progress Notes (Deleted)
Cardiology Office Note:   Date:  11/22/2019  NAME:  Jason Sampson    MRN: [id number] DOB:  Apr 29, 1981   PCP:  Seward Carol, MD  Cardiologist:  Evalina Field, MD  Electrophysiologist:  None   Referring MD: Seward Carol, MD   No chief complaint on file. ***  History of Present Illness:   Jason Sampson is a 39 y.o. male with a hx of HTN, PVCs who presents for follow-up of chest pain.   Problem List 1. PVCs -normal coronary CTA 2. HTN  Past Medical History: Past Medical History:  Diagnosis Date  . Asthma   . GERD (gastroesophageal reflux disease)   . Hypercholesterolemia    PURE  . Hypertension   . Palpitations   . Seasonal allergies     Past Surgical History: Past Surgical History:  Procedure Laterality Date  . FOOT SURGERY    . Lapidus Fusion Left 04/21/2013   @[facility]     Current Medications: No outpatient medications have been marked as taking for the 11/25/19 encounter (Appointment) with O'Neal, Cassie Freer, MD.     Allergies:    Patient has no known allergies.   Social History: Social History   Socioeconomic History  . Marital status: Married    Spouse name: Not on file  . Number of children: 4  . Years of education: Not on file  . Highest education level: Not on file  Occupational History  . Not on file  Tobacco Use  . Smoking status: Never Smoker  . Smokeless tobacco: Never Used  Substance and Sexual Activity  . Alcohol use: No  . Drug use: No  . Sexual activity: Not on file  Other Topics Concern  . Not on file  Social History Narrative  . Not on file   Social Determinants of Health   Financial Resource Strain:   . Difficulty of Paying Living Expenses: Not on file  Food Insecurity:   . Worried About Charity fundraiser in the Last Year: Not on file  . Ran Out of Food in the Last Year: Not on file  Transportation Needs:   . Lack of Transportation (Medical): Not on file  . Lack of Transportation (Non-Medical): Not on file    Physical Activity:   . Days of Exercise per Week: Not on file  . Minutes of Exercise per Session: Not on file  Stress:   . Feeling of Stress : Not on file  Social Connections:   . Frequency of Communication with Friends and Family: Not on file  . Frequency of Social Gatherings with Friends and Family: Not on file  . Attends Religious Services: Not on file  . Active Member of Clubs or Organizations: Not on file  . Attends Archivist Meetings: Not on file  . Marital Status: Not on file     Family History: The patient's ***family history includes Atrial fibrillation in his father; CVA in his mother; Hypertension in his father and mother; Hypothyroidism in his sister; Lupus in his mother; Migraines in his sister; Sarcoidosis in his mother.  ROS:   All other ROS reviewed and negative. Pertinent positives noted in the HPI.     EKGs/Labs/Other Studies Reviewed:   The following studies were personally reviewed by me today:  EKG:  EKG is *** ordered today.  The ekg ordered today demonstrates ***, and was personally reviewed by me.   TTE 07/11/2019 1. Left ventricular ejection fraction, by visual estimation, is 55 to  60%. The left  ventricle has normal function. Normal left ventricular size.  There is no left ventricular hypertrophy.  2. Global right ventricle has normal systolic function.The right  ventricular size is moderately enlarged. No increase in right ventricular  wall thickness.  3. Left atrial size was normal.  4. Right atrial size was normal.  5. The mitral valve is normal in structure. Trace mitral valve  regurgitation. No evidence of mitral stenosis.  6. The tricuspid valve is normal in structure. Tricuspid valve  regurgitation was not visualized by color flow Doppler.  7. The aortic valve is normal in structure. Aortic valve regurgitation  was not visualized by color flow Doppler. Structurally normal aortic  valve, with no evidence of sclerosis or  stenosis.  8. The pulmonic valve was normal in structure. Pulmonic valve  regurgitation is not visualized by color flow Doppler.  9. The inferior vena cava is normal in size with greater than 50%  respiratory variability, suggesting right atrial pressure of 3 mmHg.  Recent Labs: 06/28/2019: Hemoglobin 15.9; Platelets 251 08/24/2019: BUN 11; Creatinine, Ser 0.94; Potassium 4.3; Sodium 140   Recent Lipid Panel    Component Value Date/Time   CHOL 187 08/24/2019 0941   TRIG 127 08/24/2019 0941   HDL 44 08/24/2019 0941   CHOLHDL 4.3 08/24/2019 0941   LDLCALC 120 (H) 08/24/2019 0941    Physical Exam:   VS:  There were no vitals taken for this visit.   Wt Readings from Last 3 Encounters:  07/06/19 209 lb 6.4 oz (95 kg)  06/28/19 209 lb (94.8 kg)  12/15/18 204 lb (92.5 kg)    General: Well nourished, well developed, in no acute distress Heart: Atraumatic, normal size  Eyes: PEERLA, EOMI  Neck: Supple, no JVD Endocrine: No thryomegaly Cardiac: Normal S1, S2; RRR; no murmurs, rubs, or gallops Lungs: Clear to auscultation bilaterally, no wheezing, rhonchi or rales  Abd: Soft, nontender, no hepatomegaly  Ext: No edema, pulses 2+ Musculoskeletal: No deformities, BUE and BLE strength normal and equal Skin: Warm and dry, no rashes   Neuro: Alert and oriented to person, place, time, and situation, CNII-XII grossly intact, no focal deficits  Psych: Normal mood and affect   ASSESSMENT:   Jason Sampson is a 39 y.o. male who presents for the following: No diagnosis found.  PLAN:   There are no diagnoses linked to this encounter.  Disposition: No follow-ups on file.  Medication Adjustments/Labs and Tests Ordered: Current medicines are reviewed at length with the patient today.  Concerns regarding medicines are outlined above.  No orders of the defined types were placed in this encounter.  No orders of the defined types were placed in this encounter.   There are no Patient  Instructions on file for this visit.   Signed, Lenna Gilford. Flora Lipps, MD [facility]  [address] [place], [place] [address] [phone]  11/22/2019 6:04 PM

## 2019-11-25 ENCOUNTER — Ambulatory Visit: Payer: Self-pay | Admitting: Cardiovascular Disease

## 2019-12-01 ENCOUNTER — Telehealth: Payer: Self-pay

## 2019-12-01 NOTE — Telephone Encounter (Signed)
Contacted patient, advised that when he is scheduled is down as a office visit and that is a virtual day for Dr.O'Neal. Advised patient to call back on if he wanted in office, or if virtual was okay.   Left call back number.

## 2019-12-06 ENCOUNTER — Encounter: Payer: Self-pay | Admitting: Cardiovascular Disease

## 2019-12-06 ENCOUNTER — Telehealth (INDEPENDENT_AMBULATORY_CARE_PROVIDER_SITE_OTHER): Payer: BC Managed Care – PPO | Admitting: Cardiovascular Disease

## 2019-12-06 VITALS — BP 157/90 | HR 71 | Ht 67.5 in

## 2019-12-06 DIAGNOSIS — R079 Chest pain, unspecified: Secondary | ICD-10-CM | POA: Diagnosis not present

## 2019-12-06 DIAGNOSIS — I10 Essential (primary) hypertension: Secondary | ICD-10-CM

## 2019-12-06 DIAGNOSIS — R002 Palpitations: Secondary | ICD-10-CM

## 2019-12-06 MED ORDER — LOSARTAN POTASSIUM 25 MG PO TABS: 25.0000 mg | ORAL_TABLET | Freq: Every day | ORAL | 3 refills | Status: DC

## 2019-12-06 NOTE — Progress Notes (Signed)
Virtual Visit via Video Note   This visit type was conducted due to national recommendations for restrictions regarding the COVID-19 Pandemic (e.g. social distancing) in an effort to limit this patient's exposure and mitigate transmission in our community.  Due to his co-morbid illnesses, this patient is at least at moderate risk for complications without adequate follow up.  This format is felt to be most appropriate for this patient at this time.  All issues noted in this document were discussed and addressed.  A limited physical exam was performed with this format.  Please refer to the patient's chart for his consent to telehealth for [facility].   The patient was identified using 2 identifiers.  Date:  12/06/2019   ID:  Jason Sampson, DOB 09-01-1981, MRN [id number]  Patient Location: Home Provider Location: Office  PCP:  Renford Dills, MD  Cardiologist:  Reatha Harps, MD   Evaluation Performed:  Follow-Up Visit  Chief Complaint:  Chest pain  History of Present Illness:    Jason Sampson is a 39 y.o. male with hypertension who presents for follow-up of chest pain/palpitations. Seen in ER 05/2019 for chest pain and palpitations. Noted to have frequent PVCs with K 2.7. Normal echo and normal cardiac CTA. He presents for follow-up today.   He reports he has been doing well since last visit. He has had 2 episodes of chest pain/palpitations since that visit. Occur at night. Described as sharp L sided chest pain associated with palpitations that resolved within seconds without intervention. He reports eating more bananas and potassium supplement improves symptoms. I suspect he is having PVCs like in hospital. No exercise CP or SOB. No limitations reported. He is working from home (works in Phot[employer]) and not that active. Has started to work on salt restriction. He is ok to switch his BP medications to losartan to avoid low K.   The patient does not have symptoms concerning for  COVID-19 infection (fever, chills, cough, or new shortness of breath).    Past Medical History:  Diagnosis Date  . Asthma   . GERD (gastroesophageal reflux disease)   . Hypercholesterolemia    PURE  . Hypertension   . Palpitations   . Seasonal allergies    Past Surgical History:  Procedure Laterality Date  . FOOT SURGERY    . Lapidus Fusion Left 04/21/2013   @[facility]      Current Meds  Medication Sig  . amLODipine (NORVASC) 10 MG tablet Take 10 mg by mouth daily.   aspirin EC 81 MG tablet Take 81 mg by mouth daily.    Marland Kitchen atorvastatin (LIPITOR) 10 MG tablet Take 10 mg by mouth daily.  . fluticasone (FLONASE) 50 MCG/ACT nasal spray Place 1 spray into both nostrils daily as needed for allergies.   . [DISCONTINUED] chlorthalidone (HYGROTON) 25 MG tablet Take 25 mg by mouth daily.     Allergies:   Patient has no known allergies.   Social History   Tobacco Use  . Smoking status: Never Smoker  . Smokeless tobacco: Never Used  Substance Use Topics  . Alcohol use: No  . Drug use: No     Family Hx: The patient's family history includes Atrial fibrillation in his father; CVA in his mother; Hypertension in his father and mother; Hypothyroidism in his sister; Lupus in his mother; Migraines in his sister; Sarcoidosis in his mother.  ROS:   Please see the history of present illness.     All other systems reviewed and are  negative.   Prior CV studies:   The following studies were reviewed today:  TTE 07/11/2019 1. Left ventricular ejection fraction, by visual estimation, is 55 to  60%. The left ventricle has normal function. Normal left ventricular size.  There is no left ventricular hypertrophy.  2. Global right ventricle has normal systolic function.The right  ventricular size is moderately enlarged. No increase in right ventricular  wall thickness.  3. Left atrial size was normal.  4. Right atrial size was normal.  5. The mitral valve is normal in structure. Trace  mitral valve  regurgitation. No evidence of mitral stenosis.  6. The tricuspid valve is normal in structure. Tricuspid valve  regurgitation was not visualized by color flow Doppler.  7. The aortic valve is normal in structure. Aortic valve regurgitation  was not visualized by color flow Doppler. Structurally normal aortic  valve, with no evidence of sclerosis or stenosis.  8. The pulmonic valve was normal in structure. Pulmonic valve  regurgitation is not visualized by color flow Doppler.  9. The inferior vena cava is normal in size with greater than 50%  respiratory variability, suggesting right atrial pressure of 3 mmHg.   Holter 07/2018 1.  The baseline rhythm is normal sinus with an average heart rate of 77 bpm 2.  There are occasional PVCs and PACs 3.  There are no sustained arrhythmias 4.  There is no atrial fibrillation or flutter 5.  There are no pathologic pauses greater than 3 seconds or significant bradycardic events  CCTA 08/26/2019 IMPRESSION: 1. Coronary calcium score of 0.  2. Normal coronary origin with right dominance.  3. Significant motion artifact with limitations regarding the interpretation of the RCA/LCX as described above. Despite these limitations, there does not appear to be any evidence of CAD.  RECOMMENDATIONS: 1. No evidence of CAD (0%). Consider non-atherosclerotic causes of chest pain.  Labs/Other Tests and Data Reviewed:    EKG:  No ECG reviewed.  Recent Labs: 06/28/2019: Hemoglobin 15.9; Platelets 251 08/24/2019: BUN 11; Creatinine, Ser 0.94; Potassium 4.3; Sodium 140   Recent Lipid Panel Lab Results  Component Value Date/Time   CHOL 187 08/24/2019 09:41 AM   TRIG 127 08/24/2019 09:41 AM   HDL 44 08/24/2019 09:41 AM   CHOLHDL 4.3 08/24/2019 09:41 AM   LDLCALC 120 (H) 08/24/2019 09:41 AM    Wt Readings from Last 3 Encounters:  07/06/19 209 lb 6.4 oz (95 kg)  06/28/19 209 lb (94.8 kg)  12/15/18 204 lb (92.5 kg)      Objective:    Vital Signs:  BP (!) 157/90   Pulse 71   Ht 5' 7.5" (1.715 m)   BMI 32.31 kg/m    VITAL SIGNS:  reviewed  Gen: NAD Pulm: Talking full sentences, no SOB Psych: normal mood/affect   ASSESSMENT & PLAN:    1. Chest pain, unspecified type 2. Palpitations -normal echo and normal CCTA. No CAD. Suspect this is related to low K and PVCs. He had K 2.7 and PVCs in hospital when he had symptoms.  -we will stop his chlorthalidone as I suspect this is leading to low K and causing his PVCs.  -we will start losartan 25 mg QD -he will monitor symptoms but they are much improved  3. Essential hypertension -stop chlorthalidone and start losartan 25 mg QD -he will have blood work in PCP office and see me in 6 months    COVID-19 Education: The signs and symptoms of COVID-19 were discussed with the patient  and how to seek care for testing (follow up with PCP or arrange E-visit).  The importance of social distancing was discussed today.  Time:   Today, I have spent 35 minutes with the patient with telehealth technology discussing the above problems.     Medication Adjustments/Labs and Tests Ordered: Current medicines are reviewed at length with the patient today.  Concerns regarding medicines are outlined above.   Tests Ordered: No orders of the defined types were placed in this encounter.   Medication Changes: Meds ordered this encounter  Medications  . losartan (COZAAR) 25 MG tablet    Sig: Take 1 tablet (25 mg total) by mouth daily.    Dispense:  90 tablet    Refill:  3    Follow Up:  In Person in 6 month(s)  Signed, Reatha Harps, MD  12/06/2019 9:07 AM    [facility]

## 2019-12-06 NOTE — Patient Instructions (Addendum)
Medication Instructions:  Stop Chlorthalidone  Stop Potassium  Start Losartan 25 mg daily   *If you need a refill on your cardiac medications before your next appointment, please call your pharmacy*   Follow-Up: At [facility], you and your health needs are our priority.  As part of our continuing mission to provide you with exceptional heart care, we have created designated Provider Care Teams.  These Care Teams include your primary Cardiologist (physician) and Advanced Practice Providers (APPs -  Physician Assistants and Nurse Practitioners) who all work together to provide you with the care you need, when you need it.  We recommend signing up for the patient portal called "MyChart".  Sign up information is provided on this After Visit Summary.  MyChart is used to connect with patients for Virtual Visits (Telemedicine).  Patients are able to view lab/test results, encounter notes, upcoming appointments, etc.  Non-urgent messages can be sent to your provider as well.   To learn more about what you can do with MyChart, go to [email or web address].    Your next appointment:   6 month(s)  The format for your next appointment:   In Person  Provider:   Lennie Odor, MD

## 2019-12-07 ENCOUNTER — Encounter: Payer: Self-pay | Admitting: Cardiovascular Disease

## 2020-01-13 ENCOUNTER — Other Ambulatory Visit: Payer: Self-pay

## 2020-01-13 ENCOUNTER — Telehealth: Payer: Self-pay | Admitting: Cardiovascular Disease

## 2020-01-13 MED ORDER — LISINOPRIL 10 MG PO TABS: 10.0000 mg | ORAL_TABLET | Freq: Every day | ORAL | 3 refills | Status: DC

## 2020-01-13 NOTE — Telephone Encounter (Signed)
Let's switch to lisinopril 10 mg daily.   Gerri Spore T. Flora Lipps, MD [facility]  [address], [address] [place], [place] [address] [phone]  4:18 PM

## 2020-01-13 NOTE — Telephone Encounter (Signed)
I spoke to the patient who was started on Losartan 25 mg Daily on 12/06/19 and soon afterward noticed swelling in his ankles/feet, particularly the left.  He stopped the Losartan and the swelling dissipated.  He tried resuming and the swelling returned.  He now has stopped completely and would like advisement.

## 2020-01-13 NOTE — Telephone Encounter (Signed)
Contacted patient- he is aware of switch in medication.  Patient thankful for call back.

## 2020-01-13 NOTE — Telephone Encounter (Signed)
New Message    Pt c/o medication issue:  1. Name of Medication: losartan (COZAAR) 25 MG tablet  2. How are you currently taking this medication (dosage and times per day)? Not taking   3. Are you having a reaction (difficulty breathing--STAT)? No   4. What is your medication issue? Pt says he stopped taking this medication because it made his feet swollen. He says he stopped taking the medication and his feet are no longer swollen    Please call back

## 2020-01-13 NOTE — Telephone Encounter (Signed)
I spoke to the patient and informed him that Dr Flora Lipps would like to stop Losartan and start Lisinopril 10 mg Daily.  He verbalized understanding.

## 2020-04-18 ENCOUNTER — Emergency Department (HOSPITAL_BASED_OUTPATIENT_CLINIC_OR_DEPARTMENT_OTHER): Payer: BC Managed Care – PPO

## 2020-04-18 ENCOUNTER — Other Ambulatory Visit: Payer: Self-pay

## 2020-04-18 ENCOUNTER — Encounter (HOSPITAL_BASED_OUTPATIENT_CLINIC_OR_DEPARTMENT_OTHER): Payer: Self-pay | Admitting: Emergency Medicine

## 2020-04-18 ENCOUNTER — Emergency Department (HOSPITAL_BASED_OUTPATIENT_CLINIC_OR_DEPARTMENT_OTHER)
Admission: EM | Admit: 2020-04-18 | Discharge: 2020-04-18 | Disposition: A | Discharge status: Home / Self Care | Payer: BC Managed Care – PPO | Attending: Emergency Medicine | Admitting: Emergency Medicine

## 2020-04-18 DIAGNOSIS — Z79899 Other long term (current) drug therapy: Secondary | ICD-10-CM | POA: Diagnosis not present

## 2020-04-18 DIAGNOSIS — J181 Lobar pneumonia, unspecified organism: Secondary | ICD-10-CM | POA: Diagnosis not present

## 2020-04-18 DIAGNOSIS — R0981 Nasal congestion: Secondary | ICD-10-CM | POA: Diagnosis not present

## 2020-04-18 DIAGNOSIS — I10 Essential (primary) hypertension: Secondary | ICD-10-CM

## 2020-04-18 DIAGNOSIS — Z7982 Long term (current) use of aspirin: Secondary | ICD-10-CM | POA: Insufficient documentation

## 2020-04-18 DIAGNOSIS — J189 Pneumonia, unspecified organism: Secondary | ICD-10-CM

## 2020-04-18 IMAGING — DX DG CHEST 1V PORT
1 series · 1 of 1 positions shown · non-contrast
Comparison: [DATE]

CLINICAL DATA: Congestion for 1.5 weeks, productive cough

EXAM:
PORTABLE CHEST 1 VIEW

[chest ap]
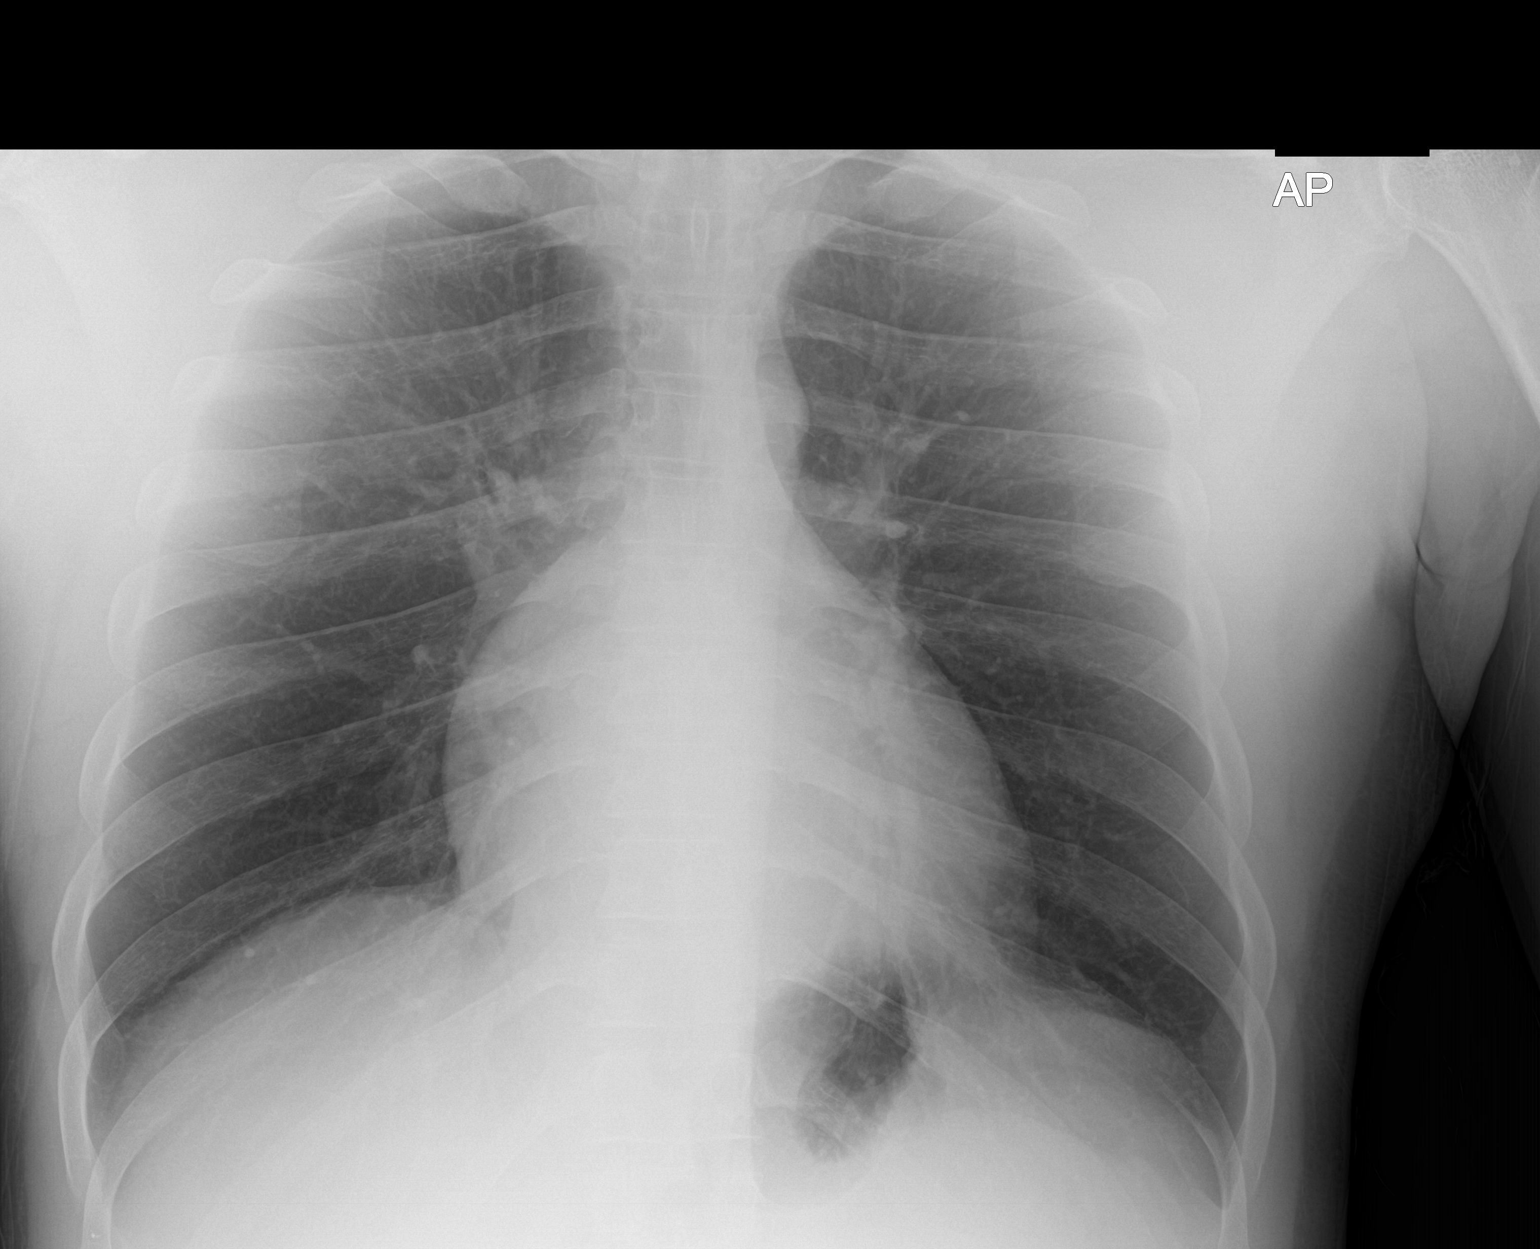

[1 of 1 positions shown; findings below may reference images not displayed]

FINDINGS: Single frontal view of the chest demonstrates an unremarkable
cardiac silhouette. There is left lower lobe consolidation in the
retrocardiac region. No effusion or pneumothorax. No acute bony
abnormalities.
IMPRESSION: 1. Left lower lobe consolidation consistent with bronchopneumonia.

## 2020-04-18 MED ORDER — ALBUTEROL SULFATE HFA 108 (90 BASE) MCG/ACT IN AERS
2.0000 | INHALATION_SPRAY | Freq: Once | RESPIRATORY_TRACT | Status: AC
  Administered 2020-04-18: 2 via RESPIRATORY_TRACT
  Filled 2020-04-18: qty 6.7

## 2020-04-18 MED ORDER — PREDNISONE 50 MG PO TABS
60.0000 mg | ORAL_TABLET | Freq: Once | ORAL | Status: AC
  Administered 2020-04-18: 60 mg via ORAL
  Filled 2020-04-18: qty 1

## 2020-04-18 MED ORDER — LOSARTAN POTASSIUM 25 MG PO TABS
50.0000 mg | ORAL_TABLET | Freq: Once | ORAL | Status: AC
  Administered 2020-04-18: 50 mg via ORAL
  Filled 2020-04-18: qty 2

## 2020-04-18 MED ORDER — AMLODIPINE BESYLATE 5 MG PO TABS
10.0000 mg | ORAL_TABLET | Freq: Once | ORAL | Status: AC
  Administered 2020-04-18: 10 mg via ORAL
  Filled 2020-04-18: qty 2

## 2020-04-18 MED ORDER — DOXYCYCLINE HYCLATE 100 MG PO CAPS
100.0000 mg | ORAL_CAPSULE | Freq: Two times a day (BID) | ORAL | 0 refills | Status: AC
Start: 2020-04-18 — End: 2020-04-28

## 2020-04-18 MED ORDER — DOXYCYCLINE HYCLATE 100 MG PO TABS
100.0000 mg | ORAL_TABLET | Freq: Once | ORAL | Status: AC
  Administered 2020-04-18: 100 mg via ORAL
  Filled 2020-04-18: qty 1

## 2020-04-18 MED ORDER — ACETAMINOPHEN 500 MG PO TABS
1000.0000 mg | ORAL_TABLET | Freq: Once | ORAL | Status: AC
  Administered 2020-04-18: 1000 mg via ORAL
  Filled 2020-04-18: qty 2

## 2020-04-18 MED ORDER — HYDRALAZINE HCL 10 MG PO TABS
10.0000 mg | ORAL_TABLET | Freq: Once | ORAL | Status: AC
  Administered 2020-04-18: 10 mg via ORAL
  Filled 2020-04-18: qty 1

## 2020-04-18 MED ORDER — KETOROLAC TROMETHAMINE 60 MG/2ML IM SOLN
30.0000 mg | Freq: Once | INTRAMUSCULAR | Status: AC
  Administered 2020-04-18: 30 mg via INTRAMUSCULAR
  Filled 2020-04-18: qty 2

## 2020-04-18 NOTE — ED Provider Notes (Signed)
[facility] EMERGENCY DEPARTMENT Provider Note   CSN: [id number] Arrival date & time: 04/18/20  0140     History Chief Complaint  Patient presents with  . Nasal Congestion  . Hypertension    Jason Sampson is a 39 y.o. male.   Hypertension This is a recurrent problem. The current episode started 6 to 12 hours ago. The problem occurs constantly. The problem has been gradually worsening. Associated symptoms include headaches. Pertinent negatives include no chest pain, no abdominal pain and no shortness of breath. Nothing aggravates the symptoms. Nothing relieves the symptoms. He has tried nothing for the symptoms. The treatment provided no relief.       Past Medical History:  Diagnosis Date  . Asthma   . GERD (gastroesophageal reflux disease)   . Hypercholesterolemia    PURE  . Hypertension   . Palpitations   . Seasonal allergies     Patient Active Problem List   Diagnosis Date Noted  . Palpitations 12/07/2018  . PVC's (premature ventricular contractions) 12/07/2018  . Neck pain 02/05/2018  . Acute seasonal allergic rhinitis 09/01/2016  . Essential hypertension 11/14/2014  . Complication of internal fixation device (HCC) 09/09/2013  . Pain in left foot 09/09/2013  . Status post foot surgery 04/26/2013  . Plantar fasciitis of left foot 03/30/2013  . Metatarsal deformity 03/30/2013    Past Surgical History:  Procedure Laterality Date  . FOOT SURGERY    . Lapidus Fusion Left 04/21/2013   @[facility]        Family History  Problem Relation Age of Onset  . Lupus Mother   . Sarcoidosis Mother   . Hypertension Mother   . CVA Mother   . Aneurysm Mother   . Atrial fibrillation Father   . Hypertension Father   . Migraines Sister   . Hypothyroidism Sister     Social History   Tobacco Use  . Smoking status: Never Smoker  . Smokeless tobacco: Never Used  Vaping Use  . Vaping Use: Never used  Substance Use Topics  . Alcohol use: No  . Drug use: No      Home Medications Prior to Admission medications   Medication Sig Start Date End Date Taking? Authorizing Provider  losartan (COZAAR) 25 MG tablet Take 25 mg by mouth daily.   Yes [provider]  amLODipine (NORVASC) 10 MG tablet Take 10 mg by mouth daily.     [provider]  aspirin EC 81 MG tablet Take 81 mg by mouth daily.      [provider]  atorvastatin (LIPITOR) 10 MG tablet Take 10 mg by mouth daily. 08/18/18   [provider]  atorvastatin (LIPITOR) 20 MG tablet Take 20 mg by mouth daily. 03/24/20   [provider]  azithromycin (ZITHROMAX) 250 MG tablet Take by mouth. 04/14/20   [provider]  brompheniramine-pseudoephedrine-DM 30-2-10 MG/5ML syrup Take 10 mLs by mouth 4 (four) times daily as needed. 04/10/20   [provider]  cetirizine (ZYRTEC) 10 MG tablet Take 10 mg by mouth daily.    [provider]  doxycycline (VIBRAMYCIN) 100 MG capsule Take 1 capsule (100 mg total) by mouth 2 (two) times daily for 10 days. 04/18/20 04/28/20  Kenia Teagarden, 04/30/20, MD  fluticasone (FLONASE) 50 MCG/ACT nasal spray Place 1 spray into both nostrils daily as needed for allergies.     [provider]  ipratropium (ATROVENT) 0.06 % nasal spray Place 2 sprays into both nostrils 4 (four) times daily. 04/10/20  [provider]  lisinopril (ZESTRIL) 10 MG tablet Take 1 tablet (10 mg total) by mouth daily. 01/13/20 04/12/20  O'Neal, Ronnald Ramp, MD    Allergies    Patient has no known allergies.  Review of Systems   Review of Systems  Constitutional: Negative for activity change, appetite change, chills and fever.  HENT: Positive for congestion, rhinorrhea, sinus pressure and sinus pain.   Respiratory: Positive for cough and wheezing. Negative for shortness of breath.   Cardiovascular: Negative for chest pain.  Gastrointestinal: Negative for abdominal pain.  Neurological: Positive for headaches.  All other  systems reviewed and are negative.   Physical Exam Updated Vital Signs BP (!) 154/116 (BP Location: Right Leg)   Pulse 81   Temp 98.6 F (37 C) (Oral)   Resp 16   Ht 5' 7.5" (1.715 m)   Wt 99.3 kg   SpO2 99%   BMI 33.79 kg/m   Physical Exam Vitals and nursing note reviewed.  Constitutional:      Appearance: He is well-developed.  HENT:     Head: Normocephalic and atraumatic.     Right Ear: Tympanic membrane normal.     Left Ear: Tympanic membrane normal.     Nose: Congestion and rhinorrhea present.     Mouth/Throat:     Mouth: Mucous membranes are moist.     Pharynx: Oropharynx is clear.  Eyes:     Pupils: Pupils are equal, round, and reactive to light.  Cardiovascular:     Rate and Rhythm: Normal rate.  Pulmonary:     Effort: Pulmonary effort is normal. No respiratory distress.     Breath sounds: Wheezing (minimal, when taking a deep breath, also causes coughing) present.  Abdominal:     General: There is no distension.  Musculoskeletal:        General: Normal range of motion.     Cervical back: Normal range of motion.  Skin:    General: Skin is warm.     Coloration: Skin is not jaundiced or pale.  Neurological:     General: No focal deficit present.     Mental Status: He is alert.     ED Results / Procedures / Treatments   Labs (all labs ordered are listed, but only abnormal results are displayed) Labs Reviewed - No data to display  EKG None  Radiology DG Chest Portable 1 View  Result Date: 04/18/2020 CLINICAL DATA:  Congestion for 1.5 weeks, productive cough EXAM: PORTABLE CHEST 1 VIEW COMPARISON:  06/28/2019 FINDINGS: Single frontal view of the chest demonstrates an unremarkable cardiac silhouette. There is left lower lobe consolidation in the retrocardiac region. No effusion or pneumothorax. No acute bony abnormalities. IMPRESSION: 1. Left lower lobe consolidation consistent with bronchopneumonia. Electronically Signed   By: Sharlet Salina M.D.   On:  04/18/2020 03:22    Procedures Procedures (including critical care time)  Medications Ordered in ED Medications  albuterol (VENTOLIN HFA) 108 (90 Base) MCG/ACT inhaler 2 puff (2 puffs Inhalation Given 04/18/20 0314)  predniSONE (DELTASONE) tablet 60 mg (60 mg Oral Given 04/18/20 0310)  ketorolac (TORADOL) injection 30 mg (30 mg Intramuscular Given 04/18/20 0311)  doxycycline (VIBRA-TABS) tablet 100 mg (100 mg Oral Given 04/18/20 0440)  amLODipine (NORVASC) tablet 10 mg (10 mg Oral Given 04/18/20 0614)  losartan (COZAAR) tablet 50 mg (50 mg Oral Given 04/18/20 0613)  acetaminophen (TYLENOL) tablet 1,000 mg (1,000 mg Oral Given 04/18/20 0614)  hydrALAZINE (APRESOLINE) tablet 10 mg (10 mg Oral Given  04/18/20 3295)    ED Course  I have reviewed the triage vital signs and the nursing notes.  Pertinent labs & imaging results that were available during my care of the patient were reviewed by me and considered in my medical decision making (see chart for details).    MDM Rules/Calculators/A&P                          Chest x-ray showed pneumonia so we will go and start doxycycline for that.  His high blood pressure seemed to improve as it is headache.  Difficulty toe which was causing which however patient was basically asymptomatic at the time of discharge.  No neuro complaints.  No other evidence of hypertensive urgency to require hospital admission or further management in the emergency room.  Patient stable for discharge for PCP follow-up for further management of his high blood pressure.  Final Clinical Impression(s) / ED Diagnoses Final diagnoses:  Hypertension, unspecified type  Community acquired pneumonia of left lower lobe of lung    Rx / DC Orders ED Discharge Orders         Ordered    doxycycline (VIBRAMYCIN) 100 MG capsule  2 times daily     Discontinue  Reprint     04/18/20 0702           Loc Feinstein, Barbara Cower, MD 04/18/20 1884

## 2020-04-18 NOTE — ED Triage Notes (Signed)
Pt states he has congestion that started about a week and a half ago  Pt states he went to urgent care and was told he had a URI they treated as viral  Pt states he did not improve so he had a tele dr appt and the started him on zithromax and is on day 4 of that  Pt states he has had two negative covid test  Pt states he has a productive cough with clear sputum  Pt states for the past couple of days his blood pressure has been elevated

## 2020-04-22 ENCOUNTER — Emergency Department (HOSPITAL_BASED_OUTPATIENT_CLINIC_OR_DEPARTMENT_OTHER)
Admission: EM | Admit: 2020-04-22 | Discharge: 2020-04-22 | Disposition: A | Discharge status: Home / Self Care | Payer: BC Managed Care – PPO | Attending: Emergency Medicine | Admitting: Emergency Medicine

## 2020-04-22 ENCOUNTER — Other Ambulatory Visit: Payer: Self-pay

## 2020-04-22 ENCOUNTER — Encounter (HOSPITAL_BASED_OUTPATIENT_CLINIC_OR_DEPARTMENT_OTHER): Payer: Self-pay

## 2020-04-22 ENCOUNTER — Emergency Department (HOSPITAL_BASED_OUTPATIENT_CLINIC_OR_DEPARTMENT_OTHER): Payer: BC Managed Care – PPO

## 2020-04-22 DIAGNOSIS — Z79899 Other long term (current) drug therapy: Secondary | ICD-10-CM | POA: Insufficient documentation

## 2020-04-22 DIAGNOSIS — G4489 Other headache syndrome: Secondary | ICD-10-CM | POA: Diagnosis not present

## 2020-04-22 DIAGNOSIS — Z7982 Long term (current) use of aspirin: Secondary | ICD-10-CM | POA: Diagnosis not present

## 2020-04-22 DIAGNOSIS — R55 Syncope and collapse: Secondary | ICD-10-CM

## 2020-04-22 DIAGNOSIS — I10 Essential (primary) hypertension: Secondary | ICD-10-CM | POA: Diagnosis not present

## 2020-04-22 DIAGNOSIS — R61 Generalized hyperhidrosis: Secondary | ICD-10-CM | POA: Insufficient documentation

## 2020-04-22 DIAGNOSIS — J45909 Unspecified asthma, uncomplicated: Secondary | ICD-10-CM | POA: Insufficient documentation

## 2020-04-22 DIAGNOSIS — R0602 Shortness of breath: Secondary | ICD-10-CM | POA: Diagnosis present

## 2020-04-22 DIAGNOSIS — R519 Headache, unspecified: Secondary | ICD-10-CM

## 2020-04-22 LAB — CBC WITH DIFFERENTIAL/PLATELET
Abs Immature Granulocytes: 0.02 10*3/uL (ref 0.00–0.07)
Basophils Absolute: 0 10*3/uL (ref 0.0–0.1)
Basophils Relative: 0 %
Eosinophils Absolute: 0.1 10*3/uL (ref 0.0–0.5)
Eosinophils Relative: 1 %
HCT: 44.2 % (ref 39.0–52.0)
Hemoglobin: 15 g/dL (ref 13.0–17.0)
Immature Granulocytes: 0 %
Lymphocytes Relative: 22 %
Lymphs Abs: 1.9 10*3/uL (ref 0.7–4.0)
MCH: 29.4 pg (ref 26.0–34.0)
MCHC: 33.9 g/dL (ref 30.0–36.0)
MCV: 86.5 fL (ref 80.0–100.0)
Monocytes Absolute: 0.7 10*3/uL (ref 0.1–1.0)
Monocytes Relative: 8 %
Neutro Abs: 6 10*3/uL (ref 1.7–7.7)
Neutrophils Relative %: 69 %
Platelets: 227 10*3/uL (ref 150–400)
RBC: 5.11 MIL/uL (ref 4.22–5.81)
RDW: 12 % (ref 11.5–15.5)
WBC: 8.6 10*3/uL (ref 4.0–10.5)
nRBC: 0 % (ref 0.0–0.2)

## 2020-04-22 LAB — BASIC METABOLIC PANEL
Anion gap: 10 (ref 5–15)
BUN: 15 mg/dL (ref 6–20)
CO2: 26 mmol/L (ref 22–32)
Calcium: 9.1 mg/dL (ref 8.9–10.3)
Chloride: 105 mmol/L (ref 98–111)
Creatinine, Ser: 1.14 mg/dL (ref 0.61–1.24)
GFR calc Af Amer: >60 mL/min (ref >60)
GFR calc non Af Amer: >60 mL/min (ref >60)
Glucose, Bld: 114 mg/dL — ABNORMAL HIGH (ref 70–99)
Potassium: 3.9 mmol/L (ref 3.5–5.1)
Sodium: 141 mmol/L (ref 135–145)

## 2020-04-22 IMAGING — CT CT ANGIO HEAD
1 of 18 series · 4 of 33 positions shown · IV contrast (omnipaque)
Comparison: None.

CLINICAL DATA: Acute dizziness this morning. Headache for several
days. Family history of aneurysm.

EXAM:
CT ANGIOGRAPHY HEAD
TECHNIQUE: Multidetector CT imaging of the head was performed using the
standard protocol during bolus administration of intravenous
contrast. Multiplanar CT image reconstructions and MIPs were
obtained to evaluate the vascular anatomy.
CONTRAST:  100mL OMNIPAQUE IOHEXOL 350 MG/ML SOLN

[Series 15: axial thin · axial · 0.44mm/px · z∈[-174,-84]mm · 4 of 151 slices shown]
[im 31/151  soft-tissue]
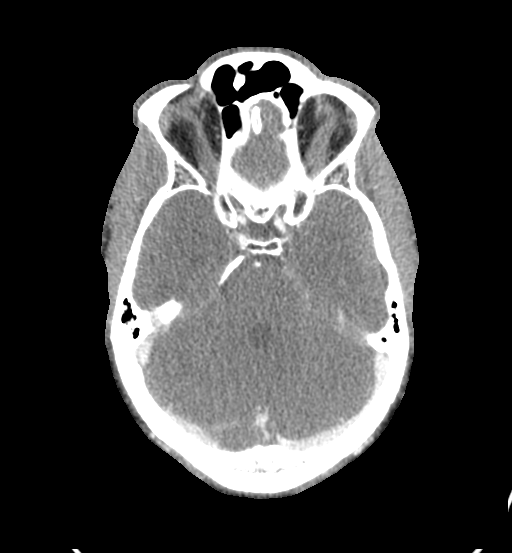
[im 61/151  bone]
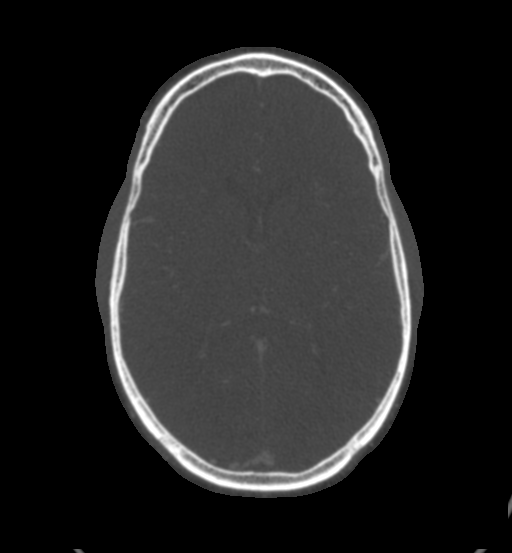
[im 91/151  soft-tissue]
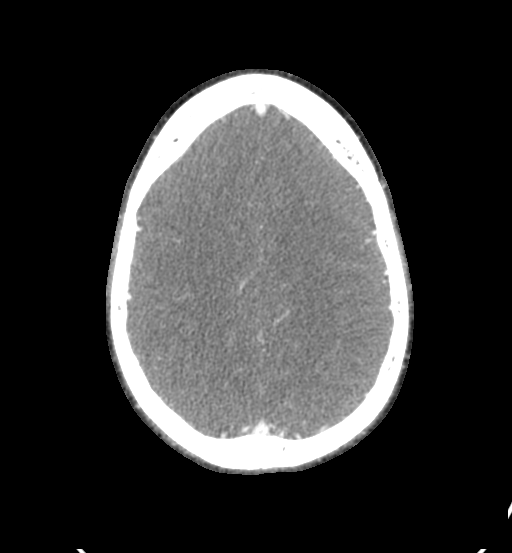
[im 121/151  bone]
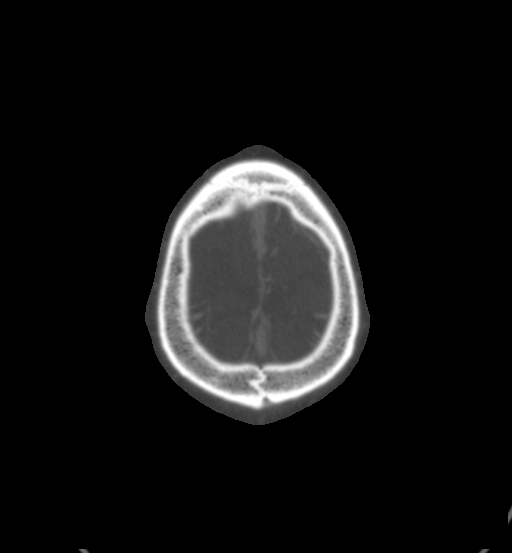

[4 of 33 positions shown; findings below may reference images not displayed]

FINDINGS: CT HEAD

Brain: There is no evidence of acute infarct, intracranial
hemorrhage, mass, midline shift, or extra-axial fluid collection.
The ventricles and sulci are normal.

Vascular: No hyperdense vessel.

Skull: No fracture or suspicious osseous lesion.

Sinuses: Partially visualized right maxillary sinus mucosal
thickening. Clear mastoid air cells.

Orbits: Unremarkable.

CTA HEAD

The patient moved resulting in incomplete imaging of the skull base
during the initial scan. This was immediately recognized and
additional images were obtained.

Anterior circulation: The internal carotid arteries are widely
patent from skull base to carotid termini. ACAs and MCAs are patent
without evidence of a flow limiting A1 or M1 stenosis or proximal
branch occlusion. No aneurysm is identified.

Posterior circulation: The visualized distal vertebral arteries are
widely patent to the basilar and codominant. Patent right AICA and
bilateral SCA origins are identified. The basilar artery is widely
patent. There are small posterior communicating arteries
bilaterally. Both PCAs are patent with image noise limiting
assessment for stenosis. No aneurysm is identified.

Venous sinuses: Patent.

Anatomic variants: None.
IMPRESSION: 1. Unremarkable noncontrast CT appearance of the brain.
2. No evidence of a cerebral aneurysm, medium or large vessel
occlusion, or flow limiting proximal intracranial stenosis.

## 2020-04-22 MED ORDER — IOHEXOL 350 MG/ML SOLN
100.0000 mL | Freq: Once | INTRAVENOUS | Status: AC | PRN
  Administered 2020-04-22: 100 mL via INTRAVENOUS

## 2020-04-22 NOTE — ED Notes (Signed)
Pt ambulatory with steady gait to restroom 

## 2020-04-22 NOTE — ED Notes (Signed)
Pt discharged to home. Discharge instructions have been discussed with patient and/or family members. Pt verbally acknowledges understanding d/c instructions, and endorses comprehension to checkout at registration before leaving.  °

## 2020-04-22 NOTE — ED Provider Notes (Signed)
[facility] EMERGENCY DEPARTMENT Provider Note   CSN: [id number] Arrival date & time: 04/22/20  1018     History Chief Complaint  Patient presents with   Shortness of Breath    Jason Sampson is a 39 y.o. male.  HPI Patient was recently diagnosed with pneumonia.  States that started doxycycline around 4 days ago.  States he been doing well with it.  States he had a one-time empty stomach and had a little GI issue.  Today however he took it before he ate.  States about 10 minutes later developed sweatiness shortness of breath diaphoresis and felt lightheaded.  States he is feeling somewhat better now.  Had an x-ray done recently that showed that the pneumonia.  Had been on azithromycin before that for sinus type infections.  States he has had some nasal drainage.  States he is also had a very severe headache at times.  States he tends to get headaches but this was different.  States that his mother has had 2 different aneurysms.  States his grandfather also had something wrong with his head that potentially could have been an aneurysm.  Patient is feeling improved now.  States he was overall feeling better in terms of the trouble breathing and pneumonia symptoms he had before.  Did not feel his heart racing.  Has a history of hypertension but no known coronary artery disease.    Past Medical History:  Diagnosis Date   Asthma    GERD (gastroesophageal reflux disease)    Hypercholesterolemia    PURE   Hypertension    Palpitations    Seasonal allergies     Patient Active Problem List   Diagnosis Date Noted   Palpitations 12/07/2018   PVC's (premature ventricular contractions) 12/07/2018   Neck pain 02/05/2018   Acute seasonal allergic rhinitis 09/01/2016   Essential hypertension 11/14/2014   Complication of internal fixation device (HCC) 09/09/2013   Pain in left foot 09/09/2013   Status post foot surgery 04/26/2013   Plantar fasciitis of left foot  03/30/2013   Metatarsal deformity 03/30/2013    Past Surgical History:  Procedure Laterality Date   FOOT SURGERY     Lapidus Fusion Left 04/21/2013   @[facility]        Family History  Problem Relation Age of Onset   Lupus Mother    Sarcoidosis Mother    Hypertension Mother    CVA Mother    Aneurysm Mother    Atrial fibrillation Father    Hypertension Father    Migraines Sister    Hypothyroidism Sister     Social History   Tobacco Use   Smoking status: Never Smoker   Smokeless tobacco: Never Used  Vaping Use   Vaping Use: Never used  Substance Use Topics   Alcohol use: No   Drug use: No    Home Medications Prior to Admission medications   Medication Sig Start Date End Date Taking? Authorizing Provider  amLODipine (NORVASC) 10 MG tablet Take 10 mg by mouth daily.     [provider]  aspirin EC 81 MG tablet Take 81 mg by mouth daily.      [provider]  atorvastatin (LIPITOR) 10 MG tablet Take 10 mg by mouth daily. 08/18/18   [provider]  atorvastatin (LIPITOR) 20 MG tablet Take 20 mg by mouth daily. 03/24/20   [provider]  azithromycin (ZITHROMAX) 250 MG tablet Take by mouth. 04/14/20   [provider]  brompheniramine-pseudoephedrine-DM 30-2-10 MG/5ML  syrup Take 10 mLs by mouth 4 (four) times daily as needed. 04/10/20   [provider]  cetirizine (ZYRTEC) 10 MG tablet Take 10 mg by mouth daily.    [provider]  doxycycline (VIBRAMYCIN) 100 MG capsule Take 1 capsule (100 mg total) by mouth 2 (two) times daily for 10 days. 04/18/20 04/28/20  Mesner, Barbara Cower, MD  fluticasone (FLONASE) 50 MCG/ACT nasal spray Place 1 spray into both nostrils daily as needed for allergies.     [provider]  ipratropium (ATROVENT) 0.06 % nasal spray Place 2 sprays into both nostrils 4 (four) times daily. 04/10/20   [provider]  lisinopril (ZESTRIL) 10 MG tablet Take 1 tablet (10 mg total)  by mouth daily. 01/13/20 04/12/20  O'NealRonnald Ramp, MD  losartan (COZAAR) 25 MG tablet Take 25 mg by mouth daily.    [provider]    Allergies    Patient has no known allergies.  Review of Systems   Review of Systems  Constitutional: Positive for diaphoresis. Negative for appetite change.  HENT: Positive for congestion.   Respiratory: Positive for cough and shortness of breath.   Cardiovascular: Negative for chest pain.  Gastrointestinal: Negative for abdominal pain.  Genitourinary: Negative for flank pain.  Musculoskeletal: Negative for back pain.  Skin: Negative for rash.  Neurological: Positive for light-headedness and headaches.  Psychiatric/Behavioral: Negative for confusion.    Physical Exam Updated Vital Signs BP (!) 134/94    Pulse 75    Temp 98 F (36.7 C) (Oral)    Resp 22    Ht 5\' 7"  (1.702 m)    Wt (!) 99.5 kg    SpO2 99%    BMI 34.35 kg/m   Physical Exam Vitals and nursing note reviewed.  HENT:     Head: Normocephalic.  Eyes:     Pupils: Pupils are equal, round, and reactive to light.  Cardiovascular:     Rate and Rhythm: Regular rhythm.  Pulmonary:     Breath sounds: No wheezing, rhonchi or rales.  Chest:     Chest wall: No tenderness.  Musculoskeletal:     Right lower leg: No edema.     Left lower leg: No edema.  Skin:    General: Skin is warm.     Capillary Refill: Capillary refill takes less than 2 seconds.  Neurological:     Mental Status: He is alert and oriented to person, place, and time.     ED Results / Procedures / Treatments   Labs (all labs ordered are listed, but only abnormal results are displayed) Labs Reviewed  BASIC METABOLIC PANEL - Abnormal; Notable for the following components:      Result Value   Glucose, Bld 114 (*)    All other components within normal limits  CBC WITH DIFFERENTIAL/PLATELET    EKG EKG Interpretation  Date/Time:  Sunday April 22 2020 10:28:30 EDT Ventricular Rate:  81 PR Interval:      QRS Duration: 85 QT Interval:  352 QTC Calculation: 409 R Axis:   59 Text Interpretation: Sinus rhythm Borderline T wave abnormalities Baseline wander in lead(s) V1 No significant change since last tracing Confirmed by [id number] [phone]) on 04/22/2020 11:52:05 AM   Radiology CT Angio Head W or Wo Contrast  Result Date: 04/22/2020 CLINICAL DATA:  Acute dizziness this morning. Headache for several days. Family history of aneurysm. EXAM: CT ANGIOGRAPHY HEAD TECHNIQUE: Multidetector CT imaging of the head was performed using the standard protocol during  bolus administration of intravenous contrast. Multiplanar CT image reconstructions and MIPs were obtained to evaluate the vascular anatomy. CONTRAST:  OMNIPAQUE IOHEXOL 350 MG/ML SOLN COMPARISON:  None. FINDINGS: CT HEAD Brain: There is no evidence of acute infarct, intracranial hemorrhage, mass, midline shift, or extra-axial fluid collection. The ventricles and sulci are normal. Vascular: No hyperdense vessel. Skull: No fracture or suspicious osseous lesion. Sinuses: Partially visualized right maxillary sinus mucosal thickening. Clear mastoid air cells. Orbits: Unremarkable. CTA HEAD The patient moved resulting in incomplete imaging of the skull base during the initial scan. This was immediately recognized and additional images were obtained. Anterior circulation: The internal carotid arteries are widely patent from skull base to carotid termini. ACAs and MCAs are patent without evidence of a flow limiting A1 or M1 stenosis or proximal branch occlusion. No aneurysm is identified. Posterior circulation: The visualized distal vertebral arteries are widely patent to the basilar and codominant. Patent right AICA and bilateral SCA origins are identified. The basilar artery is widely patent. There are small posterior communicating arteries bilaterally. Both PCAs are patent with image noise limiting assessment for stenosis. No aneurysm is identified.  Venous sinuses: Patent. Anatomic variants: None. IMPRESSION: 1. Unremarkable noncontrast CT appearance of the brain. 2. No evidence of a cerebral aneurysm, medium or large vessel occlusion, or flow limiting proximal intracranial stenosis. Electronically Signed   By: Sebastian Ache M.D.   On: 04/22/2020 13:09    Procedures Procedures (including critical care time)  Medications Ordered in ED Medications  iohexol (OMNIPAQUE) 350 MG/ML injection 100 mL (100 mLs Intravenous Contrast Given 04/22/20 1158)    ED Course  I have reviewed the triage vital signs and the nursing notes.  Pertinent labs & imaging results that were available during my care of the patient were reviewed by me and considered in my medical decision making (see chart for details).    MDM Rules/Calculators/A&P                          Patient with headache and a near syncopal episode.  Recently treated for pneumonia.  Started doxycycline and has had around 4 days of the medication.  States when he took it today had a near syncopal episode.  I think more likely related to potentially vagal event from abdominal stimulation.  Work-up otherwise reassuring.  However does have a headache.  Patient's mother and potentially grandfather both have intracranial aneurysms.  With a headache CTA done and reassuring.  Do not feel that he needs lumbar puncture at this time.  Discharge home with outpatient follow-up as needed.  Patient instructed to take his antibiotics with food.  From a pulmonary standpoint patient is well-appearing and do not think we are far enough and that we would need a change of antibiotics Final Clinical Impression(s) / ED Diagnoses Final diagnoses:  Nonintractable headache, unspecified chronicity pattern, unspecified headache type  Near syncope    Rx / DC Orders ED Discharge Orders    None       Benjiman Core, MD 04/22/20 1421

## 2020-04-22 NOTE — ED Triage Notes (Signed)
Pt arrives with c/o SOB after taking his doxycycline this morning. Pt reports he became sweaty and was having SOB reports 2 negative covid test, was told he has pneumonia at last visit which is why he is on the antibiotic. RT at bedside.

## 2020-04-22 NOTE — Discharge Instructions (Addendum)
Continue take the doxycycline, but try and take it with food.  If you continue to have reactions to it you may need a different medication.  The CTA of your head did not show an aneurysm.

## 2020-05-14 ENCOUNTER — Encounter (HOSPITAL_BASED_OUTPATIENT_CLINIC_OR_DEPARTMENT_OTHER): Payer: Self-pay | Admitting: *Deleted

## 2020-05-14 ENCOUNTER — Other Ambulatory Visit: Payer: Self-pay

## 2020-05-14 ENCOUNTER — Emergency Department (HOSPITAL_BASED_OUTPATIENT_CLINIC_OR_DEPARTMENT_OTHER)
Admission: EM | Admit: 2020-05-14 | Discharge: 2020-05-14 | Disposition: A | Discharge status: Home / Self Care | Payer: BC Managed Care – PPO | Attending: Emergency Medicine | Admitting: Emergency Medicine

## 2020-05-14 ENCOUNTER — Emergency Department (HOSPITAL_BASED_OUTPATIENT_CLINIC_OR_DEPARTMENT_OTHER): Payer: BC Managed Care – PPO

## 2020-05-14 DIAGNOSIS — Z7982 Long term (current) use of aspirin: Secondary | ICD-10-CM | POA: Diagnosis not present

## 2020-05-14 DIAGNOSIS — I10 Essential (primary) hypertension: Secondary | ICD-10-CM | POA: Diagnosis not present

## 2020-05-14 DIAGNOSIS — R0602 Shortness of breath: Secondary | ICD-10-CM | POA: Insufficient documentation

## 2020-05-14 DIAGNOSIS — R079 Chest pain, unspecified: Secondary | ICD-10-CM | POA: Diagnosis not present

## 2020-05-14 DIAGNOSIS — R519 Headache, unspecified: Secondary | ICD-10-CM | POA: Diagnosis not present

## 2020-05-14 DIAGNOSIS — J45909 Unspecified asthma, uncomplicated: Secondary | ICD-10-CM | POA: Insufficient documentation

## 2020-05-14 DIAGNOSIS — R002 Palpitations: Secondary | ICD-10-CM | POA: Diagnosis present

## 2020-05-14 DIAGNOSIS — Z79899 Other long term (current) drug therapy: Secondary | ICD-10-CM | POA: Diagnosis not present

## 2020-05-14 LAB — URINALYSIS, ROUTINE W REFLEX MICROSCOPIC
Bilirubin Urine: NEGATIVE
Glucose, UA: NEGATIVE mg/dL
Ketones, ur: NEGATIVE mg/dL
Leukocytes,Ua: NEGATIVE
Nitrite: NEGATIVE
Protein, ur: NEGATIVE mg/dL
Specific Gravity, Urine: 1.01 (ref 1.005–1.030)
pH: 7.5 (ref 5.0–8.0)

## 2020-05-14 LAB — BASIC METABOLIC PANEL
Anion gap: 10 (ref 5–15)
BUN: 14 mg/dL (ref 6–20)
CO2: 25 mmol/L (ref 22–32)
Calcium: 9.1 mg/dL (ref 8.9–10.3)
Chloride: 104 mmol/L (ref 98–111)
Creatinine, Ser: 1.01 mg/dL (ref 0.61–1.24)
GFR calc Af Amer: >60 mL/min (ref >60)
GFR calc non Af Amer: >60 mL/min (ref >60)
Glucose, Bld: 108 mg/dL — ABNORMAL HIGH (ref 70–99)
Potassium: 3.6 mmol/L (ref 3.5–5.1)
Sodium: 139 mmol/L (ref 135–145)

## 2020-05-14 LAB — CBC WITH DIFFERENTIAL/PLATELET
Abs Immature Granulocytes: 0.02 10*3/uL (ref 0.00–0.07)
Basophils Absolute: 0 10*3/uL (ref 0.0–0.1)
Basophils Relative: 0 %
Eosinophils Absolute: 0.1 10*3/uL (ref 0.0–0.5)
Eosinophils Relative: 1 %
HCT: 43.4 % (ref 39.0–52.0)
Hemoglobin: 14.9 g/dL (ref 13.0–17.0)
Immature Granulocytes: 0 %
Lymphocytes Relative: 25 %
Lymphs Abs: 1.9 10*3/uL (ref 0.7–4.0)
MCH: 29.6 pg (ref 26.0–34.0)
MCHC: 34.3 g/dL (ref 30.0–36.0)
MCV: 86.3 fL (ref 80.0–100.0)
Monocytes Absolute: 0.7 10*3/uL (ref 0.1–1.0)
Monocytes Relative: 9 %
Neutro Abs: 4.8 10*3/uL (ref 1.7–7.7)
Neutrophils Relative %: 65 %
Platelets: 237 10*3/uL (ref 150–400)
RBC: 5.03 MIL/uL (ref 4.22–5.81)
RDW: 12.3 % (ref 11.5–15.5)
WBC: 7.5 10*3/uL (ref 4.0–10.5)
nRBC: 0 % (ref 0.0–0.2)

## 2020-05-14 LAB — D-DIMER, QUANTITATIVE: D-Dimer, Quant: <0.27 ug/mL-FEU (ref 0.00–0.50)

## 2020-05-14 LAB — TSH: TSH: 1.648 u[IU]/mL (ref 0.350–4.500)

## 2020-05-14 LAB — URINALYSIS, MICROSCOPIC (REFLEX)
Bacteria, UA: NONE SEEN
Squamous Epithelial / HPF: NONE SEEN (ref 0–5)
WBC, UA: NONE SEEN WBC/hpf (ref 0–5)

## 2020-05-14 LAB — TROPONIN I (HIGH SENSITIVITY)
Troponin I (High Sensitivity): 11 ng/L (ref <18)
Troponin I (High Sensitivity): 12 ng/L (ref <18)

## 2020-05-14 LAB — CBG MONITORING, ED: Glucose-Capillary: 98 mg/dL (ref 70–99)

## 2020-05-14 IMAGING — DX DG CHEST 1V PORT
1 series · 1 of 1 positions shown · non-contrast
Comparison: Radiograph [DATE]

CLINICAL DATA: Palpitations. Onset after drinking a slushy.
Shortness of breath.

EXAM:
PORTABLE CHEST 1 VIEW

[chest ap]
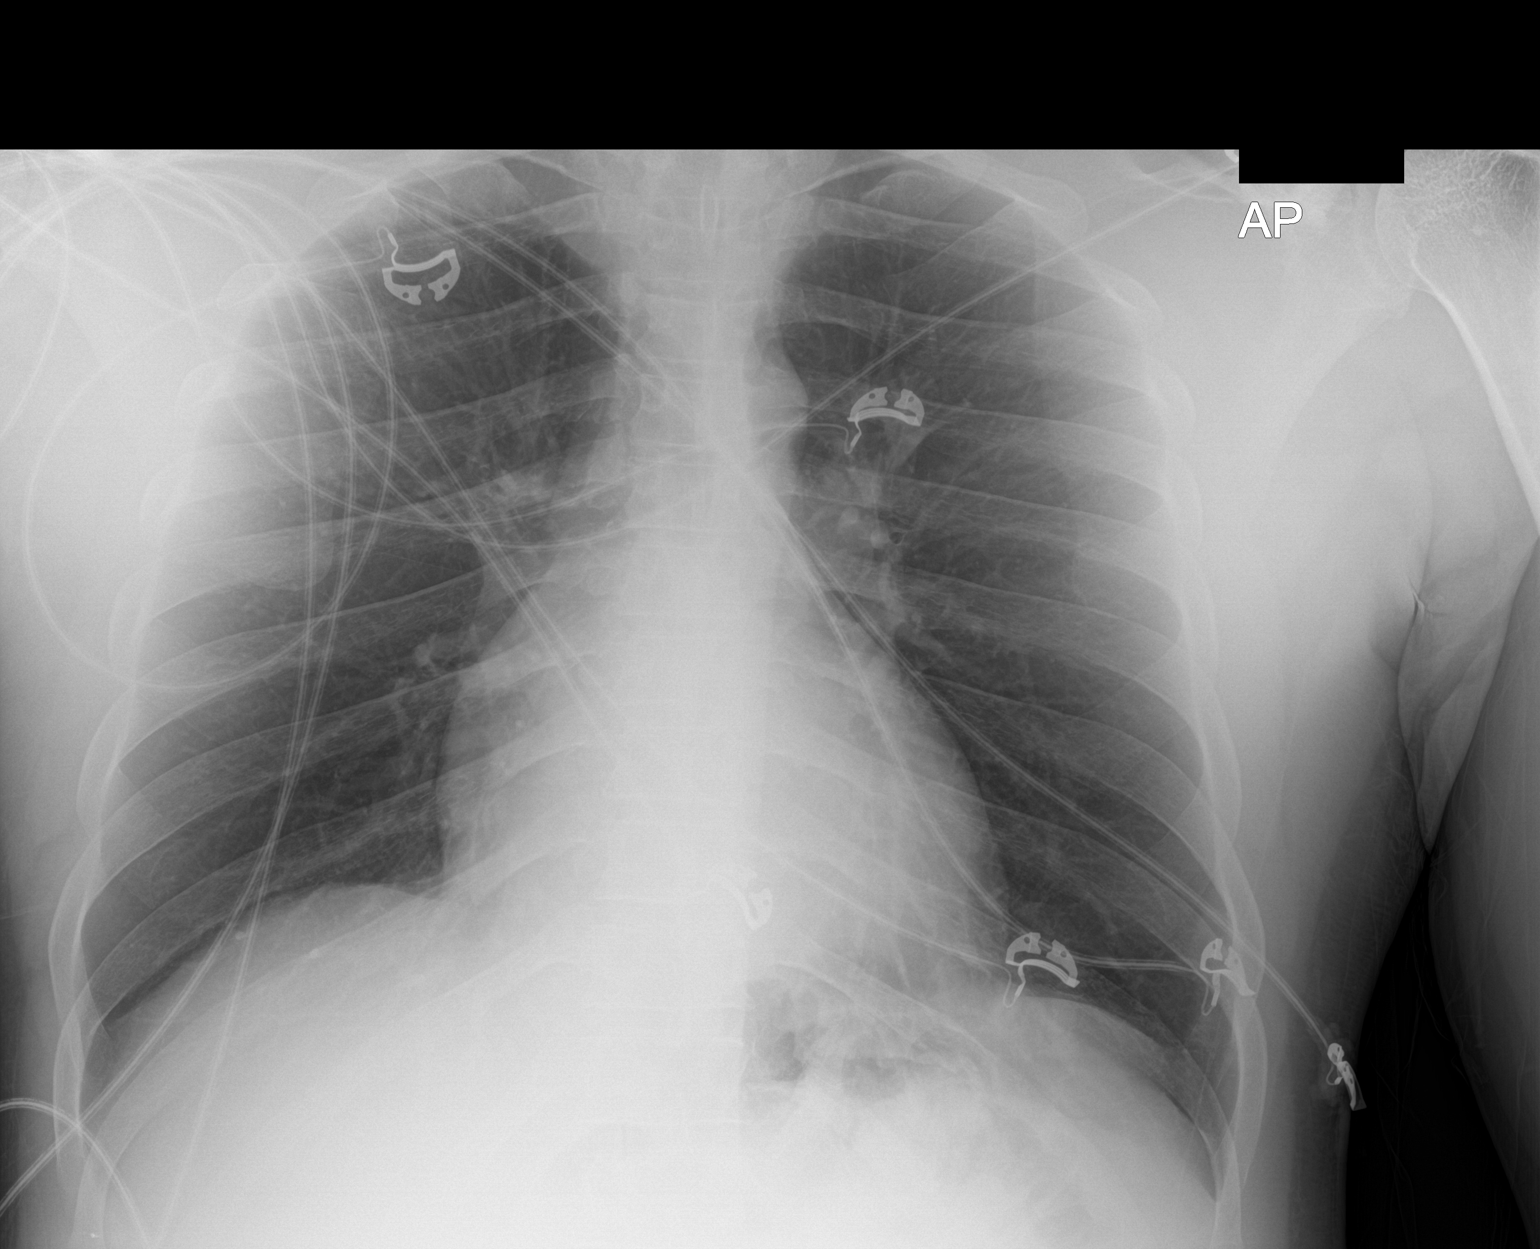

[1 of 1 positions shown; findings below may reference images not displayed]

FINDINGS: Improvement in previous left lower lobe airspace disease. No new
consolidation. Normal heart size and mediastinal contours. No
pulmonary edema. No pleural fluid or pneumothorax. No acute osseous
abnormalities are seen.
IMPRESSION: 1. No acute findings.
2. Improving left lower lobe airspace disease since prior exam.

## 2020-05-14 NOTE — ED Triage Notes (Signed)
Pt states around 7pm he drank a big slushy and afterwards felt palpations and having frequent urination. C/o feeling sob as well. Denies any chest pain.

## 2020-05-14 NOTE — ED Provider Notes (Signed)
[facility] EMERGENCY DEPARTMENT Provider Note   CSN: [id number] Arrival date & time: 05/14/20  0307     History Chief Complaint  Patient presents with  . palpations    Jason Sampson is a 39 y.o. male.  HPI     This is a 39 year old male with a history of asthma, reflux, hypertension, palpitations who presents with palpitations.  Patient states that he drank a big slushy around 7 PM last night.  He immediately have "the worst brain freeze of my life."  He states that he got home and had recurrent frequent palpitations urination.  He also describes chest pressure which is since resolved.  He reports that he has gone to urinate 7-8 times.  He is unsure if the caffeine content of that slushy and states that he only drank a little bit before he got the brain freeze.  He denies any recent fevers or illnesses.  No recent changes in medications.  He has a cardiac monitor at home and he noted tachycardia and irregular rhythm.  Was concern for atrial fibrillation for which she does not have a history.  He has a history of PVCs.  Past Medical History:  Diagnosis Date  . Asthma   . GERD (gastroesophageal reflux disease)   . Hypercholesterolemia    PURE  . Hypertension   . Palpitations   . Seasonal allergies     Patient Active Problem List   Diagnosis Date Noted  . Palpitations 12/07/2018  . PVC's (premature ventricular contractions) 12/07/2018  . Neck pain 02/05/2018  . Acute seasonal allergic rhinitis 09/01/2016  . Essential hypertension 11/14/2014  . Complication of internal fixation device (HCC) 09/09/2013  . Pain in left foot 09/09/2013  . Status post foot surgery 04/26/2013  . Plantar fasciitis of left foot 03/30/2013  . Metatarsal deformity 03/30/2013    Past Surgical History:  Procedure Laterality Date  . FOOT SURGERY    . Lapidus Fusion Left 04/21/2013   @[facility]        Family History  Problem Relation Age of Onset  . Lupus Mother   . Sarcoidosis  Mother   . Hypertension Mother   . CVA Mother   . Aneurysm Mother   . Atrial fibrillation Father   . Hypertension Father   . Migraines Sister   . Hypothyroidism Sister     Social History   Tobacco Use  . Smoking status: Never Smoker  . Smokeless tobacco: Never Used  Vaping Use  . Vaping Use: Never used  Substance Use Topics  . Alcohol use: No  . Drug use: No    Home Medications Prior to Admission medications   Medication Sig Start Date End Date Taking? Authorizing Provider  amLODipine (NORVASC) 10 MG tablet Take 10 mg by mouth daily.     [provider]  aspirin EC 81 MG tablet Take 81 mg by mouth daily.      [provider]  atorvastatin (LIPITOR) 10 MG tablet Take 10 mg by mouth daily. 08/18/18   [provider]  atorvastatin (LIPITOR) 20 MG tablet Take 20 mg by mouth daily. 03/24/20   [provider]  azithromycin (ZITHROMAX) 250 MG tablet Take by mouth. 04/14/20   [provider]  brompheniramine-pseudoephedrine-DM 30-2-10 MG/5ML syrup Take 10 mLs by mouth 4 (four) times daily as needed. 04/10/20   [provider]  cetirizine (ZYRTEC) 10 MG tablet Take 10 mg by mouth daily.    [provider]  fluticasone (FLONASE) 50 MCG/ACT nasal  spray Place 1 spray into both nostrils daily as needed for allergies.     [provider]  ipratropium (ATROVENT) 0.06 % nasal spray Place 2 sprays into both nostrils 4 (four) times daily. 04/10/20   [provider]  lisinopril (ZESTRIL) 10 MG tablet Take 1 tablet (10 mg total) by mouth daily. 01/13/20 04/12/20  O'NealRonnald Ramp, MD  losartan (COZAAR) 25 MG tablet Take 25 mg by mouth daily.    [provider]    Allergies    Patient has no known allergies.  Review of Systems   Review of Systems  Constitutional: Negative for fever.  Respiratory: Positive for shortness of breath. Negative for cough.   Cardiovascular: Positive for chest pain and  palpitations. Negative for leg swelling.  Gastrointestinal: Negative for abdominal pain, nausea and vomiting.  Neurological: Positive for headaches.  All other systems reviewed and are negative.   Physical Exam Updated Vital Signs BP (!) 123/96   Pulse 74   Temp 98.4 F (36.9 C) (Oral)   Resp 20   Ht 1.702 m (5\' 7" )   Wt 99.3 kg   SpO2 98%   BMI 34.30 kg/m   Physical Exam Vitals and nursing note reviewed.  Constitutional:      Appearance: He is well-developed.     Comments: Anxious appearing but nontoxic, no acute distress  HENT:     Head: Normocephalic and atraumatic.     Nose: Nose normal.     Mouth/Throat:     Mouth: Mucous membranes are moist.  Eyes:     Pupils: Pupils are equal, round, and reactive to light.  Cardiovascular:     Rate and Rhythm: Normal rate and regular rhythm.     Heart sounds: Normal heart sounds. No murmur heard.   Pulmonary:     Effort: Pulmonary effort is normal. No respiratory distress.     Breath sounds: Normal breath sounds. No wheezing.  Abdominal:     General: Bowel sounds are normal.     Palpations: Abdomen is soft.     Tenderness: There is no abdominal tenderness. There is no rebound.  Musculoskeletal:     Cervical back: Neck supple.     Right lower leg: No edema.     Left lower leg: No edema.  Lymphadenopathy:     Cervical: No cervical adenopathy.  Skin:    General: Skin is warm and dry.  Neurological:     Mental Status: He is alert and oriented to person, place, and time.  Psychiatric:     Comments: Anxious     ED Results / Procedures / Treatments   Labs (all labs ordered are listed, but only abnormal results are displayed) Labs Reviewed  BASIC METABOLIC PANEL - Abnormal; Notable for the following components:      Result Value   Glucose, Bld 108 (*)    All other components within normal limits  URINALYSIS, ROUTINE W REFLEX MICROSCOPIC - Abnormal; Notable for the following components:   Hgb urine dipstick TRACE (*)      All other components within normal limits  CBC WITH DIFFERENTIAL/PLATELET  D-DIMER, QUANTITATIVE (NOT AT [facility])  TSH  URINALYSIS, MICROSCOPIC (REFLEX)  CBG MONITORING, ED  TROPONIN I (HIGH SENSITIVITY)  TROPONIN I (HIGH SENSITIVITY)    EKG EKG Interpretation  Date/Time:  Monday May 14 2020 03:26:05 EDT Ventricular Rate:  98 PR Interval:    QRS Duration: 83 QT Interval:  350 QTC Calculation: 447 R Axis:   49 Text Interpretation:  Sinus rhythm Probable left atrial enlargement Confirmed by Ross Marcus ([id number]) on 05/14/2020 3:42:02 AM   Radiology DG Chest Portable 1 View  Result Date: 05/14/2020 CLINICAL DATA:  Palpitations. Onset after drinking a slushy. Shortness of breath. EXAM: PORTABLE CHEST 1 VIEW COMPARISON:  Radiograph 04/18/2020 FINDINGS: Improvement in previous left lower lobe airspace disease. No new consolidation. Normal heart size and mediastinal contours. No pulmonary edema. No pleural fluid or pneumothorax. No acute osseous abnormalities are seen. IMPRESSION: 1. No acute findings. 2. Improving left lower lobe airspace disease since prior exam. Electronically Signed   By: Narda Rutherford M.D.   On: 05/14/2020 03:56    Procedures Procedures (including critical care time)  Medications Ordered in ED Medications - No data to display  ED Course  I have reviewed the triage vital signs and the nursing notes.  Pertinent labs & imaging results that were available during my care of the patient were reviewed by me and considered in my medical decision making (see chart for details).    MDM Rules/Calculators/A&P                          Patient presents with palpitations.  Started after drinking a very cold, caffeinated beverage.  Hx of PVCs.  Overall nontoxic appearing and vital signs reassuring.  Slight tachycardia upon arrival but resolved on my evaluation.  PE is benign.  Patient EKG without arrythmia.  I reviewed his home monitor strips.  Appears to be sinus  arrhythmia with PVCs.  Not atrial fib.  WOrk-up initiated.  Doubt ACS.  Will screen for thyroid dysfunction.  Could be caffeine related.  Low risk PE and d-dimer is negative.  Trop x 2 neg.  No UTI.  NO significant metabolic derangements. CXR improving and without pneumonia or pneumothorax.  Patient remained asymptomatic and stable on cardiac monitoring.    After history, exam, and medical workup I feel the patient has been appropriately medically screened and is safe for discharge home. Pertinent diagnoses were discussed with the patient. Patient was given return precautions.  Final Clinical Impression(s) / ED Diagnoses Final diagnoses:  Palpitations    Rx / DC Orders ED Discharge Orders    None       Makya Yurko, Mayer Masker, MD 05/15/20 [phone]

## 2020-05-14 NOTE — ED Notes (Signed)
XR underway at bedside.

## 2020-05-14 NOTE — Discharge Instructions (Addendum)
You were seen today for palpitations.  Your work-up is reassuring.  Follow-up with your primary physician for recheck.  Avoid excessive caffeine or other triggers.

## 2020-05-29 ENCOUNTER — Ambulatory Visit: Payer: Private Health Insurance - Indemnity | Admitting: Allergy

## 2020-06-07 NOTE — Progress Notes (Signed)
Cardiology Office Note:   Date:  06/08/2020  NAME:  Jason Sampson    MRN: [id number] DOB:  06/16/1981   PCP:  Renford Dills, MD  Cardiologist:  Reatha Harps, MD   Referring MD: Renford Dills, MD   Chief Complaint  Patient presents with  . Follow-up   History of Present Illness:   Jason Sampson is a 39 y.o. male with a hx of HTN, GERD who presents for follow-up. He was seen 06/2019 for CP after ER visit where he was found to have hypokalemia and PVCs. CCTA normal. We stopped his chlorthalidone and changed him to losartan.  He reports has been doing fairly well.  He reports has had 2 episodes of rapid heartbeat and abnormal sensation.  He did purchase the cardia mobile device.  He actually presents with the strips from 05/14/2020.  This shows that he was in atrial fibrillation for several hours.  He actually went to the emergency room and his symptoms had resolved.  He reports this was in [facility].  He reports he also had an episode in July but did not have the cardia mobile device.  These appear to be the only 2 episodes.  He reports his chest pain symptoms have resolved.  His blood pressure is 140/88.  He is on amlodipine and losartan.  Has had no further episodes of palpitations to suggest PVCs.  I did inquire what sleep apnea but he reports that he does not have any symptoms of this.  He is starting on an exercise and diet plan.  He has normal coronary arteries on a CT scan.  His most recent echo shows normal LV function.  We did discuss pursuing medications versus watching it for now.  He is not had any episodes since August 16.  For now he would like to try conservative measures which is reasonable.  Past Medical History: Past Medical History:  Diagnosis Date  . Asthma   . GERD (gastroesophageal reflux disease)   . Hypercholesterolemia    PURE  . Hypertension   . Palpitations   . Seasonal allergies     Past Surgical History: Past Surgical History:    Procedure Laterality Date  . FOOT SURGERY    . Lapidus Fusion Left 04/21/2013   @[facility]     Current Medications: No outpatient medications have been marked as taking for the 06/08/20 encounter (Office Visit) with 08/08/20, MD.     Allergies:    Patient has no known allergies.   Social History: Social History   Socioeconomic History  . Marital status: Married    Spouse name: Not on file  . Number of children: 4  . Years of education: Not on file  . Highest education level: Not on file  Occupational History  . Not on file  Tobacco Use  . Smoking status: Never Smoker  . Smokeless tobacco: Never Used  Vaping Use  . Vaping Use: Never used  Substance and Sexual Activity  . Alcohol use: No  . Drug use: No  . Sexual activity: Not on file  Other Topics Concern  . Not on file  Social History Narrative  . Not on file   Social Determinants of Health   Financial Resource Strain:   . Difficulty of Paying Living Expenses: Not on file  Food Insecurity:   . Worried About Sande Rives in the Last Year: Not on file  . Ran Out of Food in the Last Year: Not on file  Transportation Needs:   . Freight forwarder (Medical): Not on file  . Lack of Transportation (Non-Medical): Not on file  Physical Activity:   . Days of Exercise per Week: Not on file  . Minutes of Exercise per Session: Not on file  Stress:   . Feeling of Stress : Not on file  Social Connections:   . Frequency of Communication with Friends and Family: Not on file  . Frequency of Social Gatherings with Friends and Family: Not on file  . Attends Religious Services: Not on file  . Active Member of Clubs or Organizations: Not on file  . Attends Banker Meetings: Not on file  . Marital Status: Not on file     Family History: The patient's family history includes Aneurysm in his mother; Atrial fibrillation in his father; CVA in his mother; Hypertension in his father and mother;  Hypothyroidism in his sister; Lupus in his mother; Migraines in his sister; Sarcoidosis in his mother.  ROS:   All other ROS reviewed and negative. Pertinent positives noted in the HPI.     EKGs/Labs/Other Studies Reviewed:   The following studies were personally reviewed by me today:  CCTA 08/26/2019 1. Coronary calcium score of 0.  2. Normal coronary origin with right dominance.  3. Significant motion artifact with limitations regarding the interpretation of the RCA/LCX as described above. Despite these limitations, there does not appear to be any evidence of CAD.  TTE 07/11/2019 1. Left ventricular ejection fraction, by visual estimation, is 55 to  60%. The left ventricle has normal function. Normal left ventricular size.  There is no left ventricular hypertrophy.  2. Global right ventricle has normal systolic function.The right  ventricular size is moderately enlarged. No increase in right ventricular  wall thickness.  3. Left atrial size was normal.  4. Right atrial size was normal.  5. The mitral valve is normal in structure. Trace mitral valve  regurgitation. No evidence of mitral stenosis.  6. The tricuspid valve is normal in structure. Tricuspid valve  regurgitation was not visualized by color flow Doppler.  7. The aortic valve is normal in structure. Aortic valve regurgitation  was not visualized by color flow Doppler. Structurally normal aortic  valve, with no evidence of sclerosis or stenosis.  8. The pulmonic valve was normal in structure. Pulmonic valve  regurgitation is not visualized by color flow Doppler.  9. The inferior vena cava is normal in size with greater than 50%  respiratory variability, suggesting right atrial pressure of 3 mmHg.    Recent Labs: 05/14/2020: BUN 14; Creatinine, Ser 1.01; Hemoglobin 14.9; Platelets 237; Potassium 3.6; Sodium 139; TSH 1.648   Recent Lipid Panel    Component Value Date/Time   CHOL 187 08/24/2019 0941    TRIG 127 08/24/2019 0941   HDL 44 08/24/2019 0941   CHOLHDL 4.3 08/24/2019 0941   LDLCALC 120 (H) 08/24/2019 0941    Physical Exam:   VS:  BP 140/88   Pulse 75   Ht 5' 7.5" (1.715 m)   Wt 222 lb 12.8 oz (101.1 kg)   SpO2 99%   BMI 34.38 kg/m    Wt Readings from Last 3 Encounters:  06/08/20 222 lb 12.8 oz (101.1 kg)  05/14/20 219 lb (99.3 kg)  04/22/20 (!) 219 lb 4.8 oz (99.5 kg)    General: Well nourished, well developed, in no acute distress Heart: Atraumatic, normal size  Eyes: PEERLA, EOMI  Neck: Supple, no JVD Endocrine: No thryomegaly Cardiac:  Normal S1, S2; RRR; no murmurs, rubs, or gallops Lungs: Clear to auscultation bilaterally, no wheezing, rhonchi or rales  Abd: Soft, nontender, no hepatomegaly  Ext: No edema, pulses 2+ Musculoskeletal: No deformities, BUE and BLE strength normal and equal Skin: Warm and dry, no rashes   Neuro: Alert and oriented to person, place, time, and situation, CNII-XII grossly intact, no focal deficits  Psych: Normal mood and affect   ASSESSMENT:   Theresia MajorsChristopher Mogg is a 39 y.o. male who presents for the following: 1. Paroxysmal atrial fibrillation (HCC)   2. Chest pain, unspecified type   3. Palpitations   4. PVC's (premature ventricular contractions)   5. Essential hypertension     PLAN:   1. Paroxysmal atrial fibrillation (HCC) -He presents with his cardia mobile device from 05/14/2020.  He was in atrial fibrillation at that time.  He is now in normal sinus rhythm.  He has had no further episodes.  His chads vas score is 1 due to hypertension.  Given his young age I am not recommending anticoagulation.  He does take an aspirin 81 mg daily.  I think this is okay.  For now we will continue to monitor this.  I recommended diet and exercise.  Weight loss is an effective strategy to maintain sinus rhythm.  He will pursue this.  We did talk about sleep apnea.  He does not have any symptoms of this now.  We will see if he notices anything  else.  Have also asked him to ask his wife to look out for any apneic episodes while he is sleeping.  He will do this. -He would be a good candidate for flecainide if we need to do that.  2. Chest pain, unspecified type -Likely acid reflux related.  Normal echo.  Normal cardiac CTA.  3. Palpitations 4. PVC's (premature ventricular contractions) -Not having any further PVCs since we stopped his chlorthalidone.  5. Essential hypertension -BP is okay for his age.  Would continue amlodipine and losartan.   Disposition: Return in about 3 months (around 09/07/2020).  Medication Adjustments/Labs and Tests Ordered: Current medicines are reviewed at length with the patient today.  Concerns regarding medicines are outlined above.  No orders of the defined types were placed in this encounter.  No orders of the defined types were placed in this encounter.   Patient Instructions  Medication Instructions:  The current medical regimen is effective;  continue present plan and medications.  *If you need a refill on your cardiac medications before your next appointment, please call your pharmacy*  Follow-Up: At [facility], you and your health needs are our priority.  As part of our continuing mission to provide you with exceptional heart care, we have created designated Provider Care Teams.  These Care Teams include your primary Cardiologist (physician) and Advanced Practice Providers (APPs -  Physician Assistants and Nurse Practitioners) who all work together to provide you with the care you need, when you need it.  We recommend signing up for the patient portal called "MyChart".  Sign up information is provided on this After Visit Summary.  MyChart is used to connect with patients for Virtual Visits (Telemedicine).  Patients are able to view lab/test results, encounter notes, upcoming appointments, etc.  Non-urgent messages can be sent to your provider as well.   To learn more about what you can  do with MyChart, go to [email or web address].    Your next appointment:   3 month(s)  The format for your next  appointment:   In Person  Provider:   Lennie Odor, MD      Time Spent with Patient: I have spent a total of 25 minutes with patient reviewing hospital notes, telemetry, EKGs, labs and examining the patient as well as establishing an assessment and plan that was discussed with the patient.  > 50% of time was spent in direct patient care.  Signed, Lenna Gilford. Flora Lipps, MD [facility]  [address], [address] [place], [place] [address] [phone]  06/08/2020 11:21 AM

## 2020-06-08 ENCOUNTER — Encounter: Payer: Self-pay | Admitting: Cardiovascular Disease

## 2020-06-08 ENCOUNTER — Other Ambulatory Visit: Payer: Self-pay

## 2020-06-08 ENCOUNTER — Ambulatory Visit (INDEPENDENT_AMBULATORY_CARE_PROVIDER_SITE_OTHER): Payer: BC Managed Care – PPO | Admitting: Cardiovascular Disease

## 2020-06-08 VITALS — BP 140/88 | HR 75 | Ht 67.5 in | Wt 222.8 lb

## 2020-06-08 DIAGNOSIS — R079 Chest pain, unspecified: Secondary | ICD-10-CM

## 2020-06-08 DIAGNOSIS — I480 Paroxysmal atrial fibrillation: Secondary | ICD-10-CM | POA: Diagnosis not present

## 2020-06-08 DIAGNOSIS — R002 Palpitations: Secondary | ICD-10-CM | POA: Diagnosis not present

## 2020-06-08 DIAGNOSIS — I493 Ventricular premature depolarization: Secondary | ICD-10-CM | POA: Diagnosis not present

## 2020-06-08 DIAGNOSIS — I10 Essential (primary) hypertension: Secondary | ICD-10-CM

## 2020-06-08 NOTE — Patient Instructions (Signed)
Medication Instructions:  The current medical regimen is effective;  continue present plan and medications.  *If you need a refill on your cardiac medications before your next appointment, please call your pharmacy*   Follow-Up: At [facility], you and your health needs are our priority.  As part of our continuing mission to provide you with exceptional heart care, we have created designated Provider Care Teams.  These Care Teams include your primary Cardiologist (physician) and Advanced Practice Providers (APPs -  Physician Assistants and Nurse Practitioners) who all work together to provide you with the care you need, when you need it.  We recommend signing up for the patient portal called "MyChart".  Sign up information is provided on this After Visit Summary.  MyChart is used to connect with patients for Virtual Visits (Telemedicine).  Patients are able to view lab/test results, encounter notes, upcoming appointments, etc.  Non-urgent messages can be sent to your provider as well.   To learn more about what you can do with MyChart, go to [email or web address].    Your next appointment:   3 month(s)  The format for your next appointment:   In Person  Provider:   Perry O'Neal, MD     

## 2020-06-21 ENCOUNTER — Telehealth: Payer: Self-pay | Admitting: Cardiology

## 2020-06-21 NOTE — Telephone Encounter (Signed)
Spoke to pt concerning appt schedule tomorrow with Corine Shelter, PA. Pt just had appt with Dr. Flora Lipps on 06/08/20 and is scheduled for follow-up with Dr. Flora Lipps in December. Pt stated he scheduled the appointment to follow-up from urgent care [facility]) for chest pain. Pt stated they did not do an EKG there, but told him to see his cardiologist. They also did not give him any papers from the visit. He stated he would like to keep this appointment.

## 2020-06-22 ENCOUNTER — Encounter: Payer: Self-pay | Admitting: Cardiology

## 2020-06-22 ENCOUNTER — Other Ambulatory Visit: Payer: Self-pay

## 2020-06-22 ENCOUNTER — Ambulatory Visit (INDEPENDENT_AMBULATORY_CARE_PROVIDER_SITE_OTHER): Payer: BC Managed Care – PPO | Admitting: Cardiology

## 2020-06-22 VITALS — BP 140/84 | HR 78 | Resp 20 | Ht 67.5 in | Wt 217.8 lb

## 2020-06-22 DIAGNOSIS — I10 Essential (primary) hypertension: Secondary | ICD-10-CM

## 2020-06-22 DIAGNOSIS — I493 Ventricular premature depolarization: Secondary | ICD-10-CM | POA: Diagnosis not present

## 2020-06-22 DIAGNOSIS — I480 Paroxysmal atrial fibrillation: Secondary | ICD-10-CM

## 2020-06-22 DIAGNOSIS — E876 Hypokalemia: Secondary | ICD-10-CM

## 2020-06-22 DIAGNOSIS — R079 Chest pain, unspecified: Secondary | ICD-10-CM | POA: Diagnosis not present

## 2020-06-22 NOTE — Assessment & Plan Note (Signed)
Past PVCs in setting of hypokalemia- check BMP today

## 2020-06-22 NOTE — Assessment & Plan Note (Signed)
Atypical chest pain- reassured

## 2020-06-22 NOTE — Assessment & Plan Note (Signed)
Documented by patient's Apple watch. CHADS VASC=1, he is on ASA 81mg 

## 2020-06-22 NOTE — Assessment & Plan Note (Signed)
B/P by me 118/70 in Lt arm- 116/66 in Rt arm

## 2020-06-22 NOTE — Progress Notes (Signed)
Cardiology Office Note:    Date:  06/22/2020   ID:  Jason Sampson, DOB 28-Oct-1980, MRN [id number]  PCP:  Renford Dills, MD  Cardiologist:  Reatha Harps, MD  Electrophysiologist:  None   Referring MD: Renford Dills, MD   CC: chest pain  History of Present Illness:    Jason Sampson is a pleasant 39 y.o. male with a hx of chest pain, PVCs, HTN, and hypokalemia.  He has been seen in the past for chest pain and palpitations.  He was in the emergency room in September 2020.  He had frequent PVCs.  His potassium was 2.7.  Subsequent studies include essentially normal echocardiogram and normal cardiac CTA.  He was taken off HCTZ and placed on losartan with good results.  He saw Dr. Flora Lipps last in March 2021.  Patient is in the office today after recent urgent care visit.  Few days ago he presented to urgent care after he had a episode of "sharp" localized anterior chest pain.  He said it lasted about 20 seconds.  Was not reproducible by movement or deep breathing.  He says he has been able to run a mile a day without any symptoms.  At the urgent care they said they were unable to do an EKG at that facility and suggested he follow-up with his cardiologist, hence today's visit.  Since that episode he is not had any recurrent chest pain.  His EKG in the office today is normal.  Past Medical History:  Diagnosis Date  . Asthma   . GERD (gastroesophageal reflux disease)   . Hypercholesterolemia    PURE  . Hypertension   . Palpitations   . Seasonal allergies     Past Surgical History:  Procedure Laterality Date  . FOOT SURGERY    . Lapidus Fusion Left 04/21/2013   @[facility]     Current Medications: Current Meds  Medication Sig  . amLODipine (NORVASC) 10 MG tablet Take 10 mg by mouth daily.   atorvastatin (LIPITOR) 20 MG tablet Take 20 mg by mouth daily.  Marland Kitchen atorvastatin (LIPITOR) 20 MG tablet Take 20 mg by mouth daily.  Marland Kitchen losartan (COZAAR) 25 MG tablet Take 25 mg by mouth daily.   Marland Kitchen omeprazole (PRILOSEC) 20 MG capsule Take 20 mg by mouth every morning.     Allergies:   Patient has no known allergies.   Social History   Socioeconomic History  . Marital status: Married    Spouse name: Not on file  . Number of children: 4  . Years of education: Not on file  . Highest education level: Not on file  Occupational History  . Not on file  Tobacco Use  . Smoking status: Never Smoker  . Smokeless tobacco: Never Used  Vaping Use  . Vaping Use: Never used  Substance and Sexual Activity  . Alcohol use: No  . Drug use: No  . Sexual activity: Not on file  Other Topics Concern  . Not on file  Social History Narrative  . Not on file   Social Determinants of Health   Financial Resource Strain:   . Difficulty of Paying Living Expenses: Not on file  Food Insecurity:   . Worried About Marland Kitchen in the Last Year: Not on file  . Ran Out of Food in the Last Year: Not on file  Transportation Needs:   . Lack of Transportation (Medical): Not on file  . Lack of Transportation (Non-Medical): Not on file  Physical Activity:   .  Days of Exercise per Week: Not on file  . Minutes of Exercise per Session: Not on file  Stress:   . Feeling of Stress : Not on file  Social Connections:   . Frequency of Communication with Friends and Family: Not on file  . Frequency of Social Gatherings with Friends and Family: Not on file  . Attends Religious Services: Not on file  . Active Member of Clubs or Organizations: Not on file  . Attends Banker Meetings: Not on file  . Marital Status: Not on file     Family History: The patient's family history includes Aneurysm in his mother; Atrial fibrillation in his father; CVA in his mother; Hypertension in his father and mother; Hypothyroidism in his sister; Lupus in his mother; Migraines in his sister; Sarcoidosis in his mother.  ROS:   Please see the history of present illness.     All other systems reviewed and  are negative.  EKGs/Labs/Other Studies Reviewed:    The following studies were reviewed today: Coronary CTA 08/26/2019- Ca++ score 0, no CAD  Echo 07/11/2019- Normal  EKG:  EKG is ordered today.  The ekg ordered today demonstrates NSR, HR 78, NSST changes  Recent Labs: 05/14/2020: BUN 14; Creatinine, Ser 1.01; Hemoglobin 14.9; Platelets 237; Potassium 3.6; Sodium 139; TSH 1.648  Recent Lipid Panel    Component Value Date/Time   CHOL 187 08/24/2019 0941   TRIG 127 08/24/2019 0941   HDL 44 08/24/2019 0941   CHOLHDL 4.3 08/24/2019 0941   LDLCALC 120 (H) 08/24/2019 0941    Physical Exam:    VS:  BP 140/84   Pulse 78   Resp 20   Ht 5' 7.5" (1.715 m)   Wt 217 lb 12.8 oz (98.8 kg)   SpO2 98%   BMI 33.61 kg/m     Wt Readings from Last 3 Encounters:  06/22/20 217 lb 12.8 oz (98.8 kg)  06/08/20 222 lb 12.8 oz (101.1 kg)  05/14/20 219 lb (99.3 kg)     GEN:  Well nourished, well developed in no acute distress HEENT: Normal NECK: No JVD; No carotid bruits CARDIAC: RRR, no murmurs, rubs, gallops RESPIRATORY:  Clear to auscultation without rales, wheezing or rhonchi  ABDOMEN: Soft, non-tender, non-distended MUSCULOSKELETAL:  No edema; No deformity  SKIN: Warm and dry NEUROLOGIC:  Alert and oriented x 3 PSYCHIATRIC:  Normal affect   ASSESSMENT:    Chest pain with low risk for cardiac etiology Atypical chest pain- reassured  Essential hypertension B/P by me 118/70 in Lt arm- 116/66 in Rt arm  PVC's (premature ventricular contractions) Past PVCs in setting of hypokalemia- check BMP today  PAF (paroxysmal atrial fibrillation) (HCC) Documented by patient's Apple watch. CHADS VASC=1, he is on ASA 81mg   PLAN:    Reassured his EKG was essentially normal. Check BMP with history of hypokalemia- ? Symptomatic PVCs.  Keep f/u with Dr as scheduled.    Medication Adjustments/Labs and Tests Ordered: Current medicines are reviewed at length with the patient today.   Concerns regarding medicines are outlined above.  Orders Placed This Encounter  Procedures  . Basic metabolic panel  . EKG 12-Lead   No orders of the defined types were placed in this encounter.   Patient Instructions  Medication Instructions:  Your physician recommends that you continue on your current medications as directed. Please refer to the Current Medication list given to you today.  *If you need a refill on your cardiac medications before your next  appointment, please call your pharmacy*   Lab Work: Your physician recommends that you return for lab work today: BMET  If you have labs (blood work) drawn today and your tests are completely normal, you will receive your results only by: Marland Kitchen MyChart Message (if you have MyChart) OR . A paper copy in the mail If you have any lab test that is abnormal or we need to change your treatment, we will call you to review the results.   Follow-Up: At [facility] you and your health needs are our priority.  As part of our continuing mission to provide you with exceptional heart care, we have created designated Provider Care Teams.  These Care Teams include your primary Cardiologist (physician) and Advanced Practice Providers (APPs -  Physician Assistants and Nurse Practitioners) who all work together to provide you with the care you need, when you need it.  We recommend signing up for the patient portal called "MyChart".  Sign up information is provided on this After Visit Summary.  MyChart is used to connect with patients for Virtual Visits (Telemedicine).  Patients are able to view lab/test results, encounter notes, upcoming appointments, etc.  Non-urgent messages can be sent to your provider as well.   To learn more about what you can do with MyChart, go to [email or web address].    Your next appointment:   Please keep your follow-up appointment with Dr. Flora Lipps in December      Signed, Iaan Oregel [employer], [place]  06/22/2020 4:11 PM     [facility]

## 2020-06-22 NOTE — Patient Instructions (Signed)
Medication Instructions:  Your physician recommends that you continue on your current medications as directed. Please refer to the Current Medication list given to you today.  *If you need a refill on your cardiac medications before your next appointment, please call your pharmacy*   Lab Work: Your physician recommends that you return for lab work today: BMET  If you have labs (blood work) drawn today and your tests are completely normal, you will receive your results only by:  MyChart Message (if you have MyChart) OR  A paper copy in the mail If you have any lab test that is abnormal or we need to change your treatment, we will call you to review the results.   Follow-Up: At [facility], you and your health needs are our priority.  As part of our continuing mission to provide you with exceptional heart care, we have created designated Provider Care Teams.  These Care Teams include your primary Cardiologist (physician) and Advanced Practice Providers (APPs -  Physician Assistants and Nurse Practitioners) who all work together to provide you with the care you need, when you need it.  We recommend signing up for the patient portal called "MyChart".  Sign up information is provided on this After Visit Summary.  MyChart is used to connect with patients for Virtual Visits (Telemedicine).  Patients are able to view lab/test results, encounter notes, upcoming appointments, etc.  Non-urgent messages can be sent to your provider as well.   To learn more about what you can do with MyChart, go to [email or web address].    Your next appointment:   Please keep your follow-up appointment with Dr. Flora Lipps in December

## 2020-06-23 LAB — BASIC METABOLIC PANEL
BUN/Creatinine Ratio: 12 (ref 9–20)
BUN: 13 mg/dL (ref 6–20)
CO2: 25 mmol/L (ref 20–29)
Calcium: 9.6 mg/dL (ref 8.7–10.2)
Chloride: 106 mmol/L (ref 96–106)
Creatinine, Ser: 1.07 mg/dL (ref 0.76–1.27)
GFR calc Af Amer: 101 mL/min/{1.73_m2} (ref >59)
GFR calc non Af Amer: 88 mL/min/{1.73_m2} (ref >59)
Glucose: 91 mg/dL (ref 65–99)
Potassium: 4 mmol/L (ref 3.5–5.2)
Sodium: 145 mmol/L — ABNORMAL HIGH (ref 134–144)

## 2020-06-29 ENCOUNTER — Telehealth: Payer: Self-pay | Admitting: Cardiology

## 2020-06-29 NOTE — Telephone Encounter (Signed)
Pt is returning Jason Sampson's call in regards to his BMET results.

## 2020-06-29 NOTE — Telephone Encounter (Signed)
Patient aware of results and verbalized understanding.     Abelino Derrick, PA-C  06/25/2020 5:10 PM EDT     Please let the patient know his labs looked good- K+ was normal- no change in Rx. Keep f/u as scheduled.  Corine Shelter PA-C 06/25/2020 5:09 PM

## 2020-09-04 NOTE — Progress Notes (Deleted)
Cardiology Office Note:   Date:  09/04/2020  NAME:  Jason Sampson    MRN: [id number] DOB:  [birth year]   PCP:  Renford Dills, MD  Cardiologist:  Reatha Harps, MD  Electrophysiologist:  None   Referring MD: Renford Dills, MD   No chief complaint on file. ***  History of Present Illness:   Jason Sampson is a 39 y.o. male with a hx of *** who is being seen today for the evaluation of *** at the request of ***.  Problem List 1. Paroxysmal Afib -CHADSVASC=1 -normal CCTA 2. PVCs 3. HTN  Past Medical History: Past Medical History:  Diagnosis Date  . Asthma   . GERD (gastroesophageal reflux disease)   . Hypercholesterolemia    PURE  . Hypertension   . Palpitations   . Seasonal allergies     Past Surgical History: Past Surgical History:  Procedure Laterality Date  . FOOT SURGERY    . Lapidus Fusion Left 04/21/2013   @[facility]     Current Medications: No outpatient medications have been marked as taking for the 09/07/20 encounter (Appointment) with O'Neal, 14/10/21, MD.     Allergies:    Patient has no known allergies.   Social History: Social History   Socioeconomic History  . Marital status: Married    Spouse name: Not on file  . Number of children: 4  . Years of education: Not on file  . Highest education level: Not on file  Occupational History  . Not on file  Tobacco Use  . Smoking status: Never Smoker  . Smokeless tobacco: Never Used  Vaping Use  . Vaping Use: Never used  Substance and Sexual Activity  . Alcohol use: No  . Drug use: No  . Sexual activity: Not on file  Other Topics Concern  . Not on file  Social History Narrative  . Not on file   Social Determinants of Health   Financial Resource Strain:   . Difficulty of Paying Living Expenses: Not on file  Food Insecurity:   . Worried About Ronnald Ramp in the Last Year: Not on file  . Ran Out of Food in the Last Year: Not on file  Transportation Needs:   . Lack of  Transportation (Medical): Not on file  . Lack of Transportation (Non-Medical): Not on file  Physical Activity:   . Days of Exercise per Week: Not on file  . Minutes of Exercise per Session: Not on file  Stress:   . Feeling of Stress : Not on file  Social Connections:   . Frequency of Communication with Friends and Family: Not on file  . Frequency of Social Gatherings with Friends and Family: Not on file  . Attends Religious Services: Not on file  . Active Member of Clubs or Organizations: Not on file  . Attends Programme researcher, broadcasting/film/video Meetings: Not on file  . Marital Status: Not on file     Family History: The patient's ***family history includes Aneurysm in his mother; Atrial fibrillation in his father; CVA in his mother; Hypertension in his father and mother; Hypothyroidism in his sister; Lupus in his mother; Migraines in his sister; Sarcoidosis in his mother.  ROS:   All other ROS reviewed and negative. Pertinent positives noted in the HPI.     EKGs/Labs/Other Studies Reviewed:   The following studies were personally reviewed by me today:  EKG:  EKG is *** ordered today.  The ekg ordered today demonstrates ***, and was personally reviewed  by me.   TTE 07/11/2019 1. Left ventricular ejection fraction, by visual estimation, is 55 to  60%. The left ventricle has normal function. Normal left ventricular size.  There is no left ventricular hypertrophy.  2. Global right ventricle has normal systolic function.The right  ventricular size is moderately enlarged. No increase in right ventricular  wall thickness.  3. Left atrial size was normal.  4. Right atrial size was normal.  5. The mitral valve is normal in structure. Trace mitral valve  regurgitation. No evidence of mitral stenosis.  6. The tricuspid valve is normal in structure. Tricuspid valve  regurgitation was not visualized by color flow Doppler.  7. The aortic valve is normal in structure. Aortic valve regurgitation   was not visualized by color flow Doppler. Structurally normal aortic  valve, with no evidence of sclerosis or stenosis.  8. The pulmonic valve was normal in structure. Pulmonic valve  regurgitation is not visualized by color flow Doppler.  9. The inferior vena cava is normal in size with greater than 50%  respiratory variability, suggesting right atrial pressure of 3 mmHg.   CCTA 08/26/2019 Normal  Recent Labs: 05/14/2020: Hemoglobin 14.9; Platelets 237; TSH 1.648 06/22/2020: BUN 13; Creatinine, Ser 1.07; Potassium 4.0; Sodium 145   Recent Lipid Panel    Component Value Date/Time   CHOL 187 08/24/2019 0941   TRIG 127 08/24/2019 0941   HDL 44 08/24/2019 0941   CHOLHDL 4.3 08/24/2019 0941   LDLCALC 120 (H) 08/24/2019 0941    Physical Exam:   VS:  There were no vitals taken for this visit.   Wt Readings from Last 3 Encounters:  06/22/20 217 lb 12.8 oz (98.8 kg)  06/08/20 222 lb 12.8 oz (101.1 kg)  05/14/20 219 lb (99.3 kg)    General: Well nourished, well developed, in no acute distress Head: Atraumatic, normal size  Eyes: PEERLA, EOMI  Neck: Supple, no JVD Endocrine: No thryomegaly Cardiac: Normal S1, S2; RRR; no murmurs, rubs, or gallops Lungs: Clear to auscultation bilaterally, no wheezing, rhonchi or rales  Abd: Soft, nontender, no hepatomegaly  Ext: No edema, pulses 2+ Musculoskeletal: No deformities, BUE and BLE strength normal and equal Skin: Warm and dry, no rashes   Neuro: Alert and oriented to person, place, time, and situation, CNII-XII grossly intact, no focal deficits  Psych: Normal mood and affect   ASSESSMENT:   Jason Sampson is a 39 y.o. male who presents for the following: No diagnosis found.  PLAN:   There are no diagnoses linked to this encounter.  Disposition: No follow-ups on file.  Medication Adjustments/Labs and Tests Ordered: Current medicines are reviewed at length with the patient today.  Concerns regarding medicines are outlined  above.  No orders of the defined types were placed in this encounter.  No orders of the defined types were placed in this encounter.   There are no Patient Instructions on file for this visit.   Time Spent with Patient: I have spent a total of *** minutes with patient reviewing hospital notes, telemetry, EKGs, labs and examining the patient as well as establishing an assessment and plan that was discussed with the patient.  > 50% of time was spent in direct patient care.  Signed, Lenna Gilford. Flora Lipps, MD [facility]  [address] [place], [place] [address] [phone]  09/04/2020 7:16 PM

## 2020-09-07 ENCOUNTER — Ambulatory Visit: Payer: BC Managed Care – PPO | Admitting: Cardiovascular Disease

## 2020-09-07 DIAGNOSIS — I493 Ventricular premature depolarization: Secondary | ICD-10-CM

## 2020-09-07 DIAGNOSIS — I480 Paroxysmal atrial fibrillation: Secondary | ICD-10-CM

## 2020-09-07 DIAGNOSIS — I10 Essential (primary) hypertension: Secondary | ICD-10-CM

## 2020-11-13 ENCOUNTER — Encounter: Payer: Self-pay | Admitting: Neurology

## 2020-12-03 ENCOUNTER — Encounter: Payer: Self-pay | Admitting: Physician Assistant

## 2020-12-03 ENCOUNTER — Encounter: Payer: Self-pay | Admitting: Gastroenterology

## 2020-12-03 ENCOUNTER — Ambulatory Visit: Payer: BC Managed Care – PPO | Admitting: Physician Assistant

## 2020-12-03 VITALS — BP 132/88 | HR 79 | Ht 67.5 in | Wt 218.8 lb

## 2020-12-03 DIAGNOSIS — J029 Acute pharyngitis, unspecified: Secondary | ICD-10-CM

## 2020-12-03 DIAGNOSIS — K2100 Gastro-esophageal reflux disease with esophagitis, without bleeding: Secondary | ICD-10-CM

## 2020-12-03 MED ORDER — PANTOPRAZOLE SODIUM 40 MG PO TBEC
40.0000 mg | DELAYED_RELEASE_TABLET | Freq: Every day | ORAL | 5 refills | Status: DC
Start: 2020-12-03 — End: 2021-06-10

## 2020-12-03 NOTE — Addendum Note (Signed)
Addended by: Mariane Duval on: 12/03/2020 10:37 AM   Modules accepted: Orders

## 2020-12-03 NOTE — Progress Notes (Signed)
Chief Complaint: GERD  HPI:    Mr. Jason Sampson is a 40 year old male, known to Dr. Russella Dar, with a past medical history of asthma and others listed below, who was referred to me by Renford Dills, MD for a complaint of GERD.    12/15/2018 patient seen in clinic by Dr. Russella Dar for nausea, vomiting and hiccups.  At that time increased omeprazole to 40 twice a day told to use Mylanta and continue Thorazine 3 times daily.  Also tried to schedule for an EGD.  (Patient never had EGD)    Today, the patient presents to clinic and tells me that over the past few months he has had increased issues with reflux, regardless of his Omeprazole 20 mg daily.  Describes that he has woken up a few times "gagging", with a severe burning in his throat, the last time this occurred was about 3 to 4 weeks ago.  Has also had issues with some right ear pain which he has been told is related to his reflux as they have tried to treat him for an ear infection with antibiotics multiple times with no help.  Tells me he also feels like his throat is raw, initially this was just at night and in the morning and now it is all the way throughout the day.  He has stopped eating after 8 PM which has helped him with his symptoms.  Does sometimes use 40 mg of Omeprazole which seems to help a little bit.    Denies fever, chills, weight loss, blood in his stool, nausea or vomiting.  Past Medical History:  Diagnosis Date  . Asthma   . GERD (gastroesophageal reflux disease)   . Hypercholesterolemia    PURE  . Hypertension   . Palpitations   . Seasonal allergies     Past Surgical History:  Procedure Laterality Date  . FOOT SURGERY    . Lapidus Fusion Left 04/21/2013   @[facility]     Current Outpatient Medications  Medication Sig Dispense Refill  . amLODipine (NORVASC) 10 MG tablet Take 10 mg by mouth daily.     atorvastatin (LIPITOR) 20 MG tablet Take 20 mg by mouth daily.    Marland Kitchen atorvastatin (LIPITOR) 20 MG tablet Take 20 mg by mouth daily.     Marland Kitchen losartan (COZAAR) 25 MG tablet Take 25 mg by mouth daily.    Marland Kitchen omeprazole (PRILOSEC) 20 MG capsule Take 20 mg by mouth every morning.     No current facility-administered medications for this visit.    Allergies as of 12/03/2020  . (No Known Allergies)    Family History  Problem Relation Age of Onset  . Lupus Mother   . Sarcoidosis Mother   . Hypertension Mother   . CVA Mother   . Aneurysm Mother   . Atrial fibrillation Father   . Hypertension Father   . Migraines Sister   . Hypothyroidism Sister     Social History   Socioeconomic History  . Marital status: Married    Spouse name: Not on file  . Number of children: 4  . Years of education: Not on file  . Highest education level: Not on file  Occupational History  . Not on file  Tobacco Use  . Smoking status: Never Smoker  . Smokeless tobacco: Never Used  Vaping Use  . Vaping Use: Never used  Substance and Sexual Activity  . Alcohol use: No  . Drug use: No  . Sexual activity: Not on file  Other  Topics Concern  . Not on file  Social History Narrative  . Not on file   Social Determinants of Health   Financial Resource Strain: Not on file  Food Insecurity: Not on file  Transportation Needs: Not on file  Physical Activity: Not on file  Stress: Not on file  Social Connections: Not on file  Intimate Partner Violence: Not on file    Review of Systems:    Constitutional: No weight loss, fever or chills Cardiovascular: No chest pain Respiratory: No SOB  Gastrointestinal: See HPI and otherwise negative   Physical Exam:  Vital signs: BP 132/88   Pulse 79   Ht 5' 7.5" (1.715 m)   Wt 218 lb 12.8 oz (99.2 kg)   BMI 33.76 kg/m   Constitutional:   Pleasant AA male appears to be in NAD, Well developed, Well nourished, alert and cooperative Respiratory: Respirations even and unlabored. Lungs clear to auscultation bilaterally.   No wheezes, crackles, or rhonchi.  Cardiovascular: Normal S1, S2. No MRG. Regular  rate and rhythm. No peripheral edema, cyanosis or pallor.  Gastrointestinal:  Soft, nondistended, nontender. No rebound or guarding. Normal bowel sounds. No appreciable masses or hepatomegaly. Rectal:  Not performed.  Psychiatric: Demonstrates good judgement and reason without abnormal affect or behaviors.  No recent labs or imaging.  Assessment: 1.  GERD: Increase in symptoms over the past couple of months with nighttime awakenings; consider GERD +/- esophagitis +/- PUD 2.  Sore throat  Plan: 1.  Scheduled patient for diagnostic EGD in the [facility] with Dr. Russella Dar.  Patient was provided with a detailed list of risks for the procedure and he agrees to proceed. 2.  Stop Omeprazole.  Prescribed Pantoprazole 40 mg daily, 30-60 minutes before breakfast #30 with 5 refills. 3.  Reviewed antireflux diet and lifestyle modifications. 4.  Patient to follow in clinic per recommendations from Dr. Russella Dar after time of procedure.  Hyacinth Meeker, PA-C [facility] 12/03/2020, 10:10 AM  Cc: Renford Dills, MD

## 2020-12-03 NOTE — Progress Notes (Signed)
Reviewed and agree with management plan.  Fritzie Prioleau T. Jaymin Waln, MD FACG [phone]  

## 2020-12-03 NOTE — Patient Instructions (Signed)
If you are age 39 or older, your body mass index should be between 23-30. Your Body mass index is 33.76 kg/m. If this is out of the aforementioned range listed, please consider follow up with your Primary Care Provider.  If you are age 8 or younger, your body mass index should be between 19-25. Your Body mass index is 33.76 kg/m. If this is out of the aformentioned range listed, please consider follow up with your Primary Care Provider.   We have sent the following medications to your pharmacy for you to pick up at your convenience: Pantoprazole 40 mg daily 30-60 minutes before breakfast.  Stop Omeprazole.  You have been scheduled for an endoscopy. Please follow written instructions given to you at your visit today. If you use inhalers (even only as needed), please bring them with you on the day of your procedure.  Due to recent changes in healthcare laws, you may see the results of your imaging and laboratory studies on MyChart before your provider has had a chance to review them.  We understand that in some cases there may be results that are confusing or concerning to you. Not all laboratory results come back in the same time frame and the provider may be waiting for multiple results in order to interpret others.  Please give [place] 48 hours in order for your provider to thoroughly review all the results before contacting the office for clarification of your results.

## 2020-12-05 ENCOUNTER — Other Ambulatory Visit: Payer: Self-pay

## 2020-12-05 ENCOUNTER — Encounter: Payer: Self-pay | Admitting: Gastroenterology

## 2020-12-05 ENCOUNTER — Ambulatory Visit (AMBULATORY_SURGERY_CENTER): Payer: BC Managed Care – PPO | Admitting: Gastroenterology

## 2020-12-05 VITALS — BP 137/83 | HR 77 | Temp 95.9°F | Resp 13 | Ht 67.5 in | Wt 218.0 lb

## 2020-12-05 DIAGNOSIS — K2100 Gastro-esophageal reflux disease with esophagitis, without bleeding: Secondary | ICD-10-CM

## 2020-12-05 MED ORDER — SODIUM CHLORIDE 0.9 % IV SOLN: 500.0000 mL | Freq: Once | INTRAVENOUS | Status: DC

## 2020-12-05 NOTE — Progress Notes (Signed)
Pt Drowsy. VSS. To PACU, report to RN. No anesthetic complications noted.  

## 2020-12-05 NOTE — Progress Notes (Signed)
Vs by [place] 

## 2020-12-05 NOTE — Op Note (Signed)
[facility] Patient Name: Jason Sampson Procedure Date: 12/05/2020 10:03 AM MRN: [id number] Endoscopist: Meryl Dare , MD Age: 40 Referring MD:  Date of Birth: Feb 08, 1981 Gender: Male Account #: [id number] Procedure:                Upper GI endoscopy Indications:              Gastroesophageal reflux disease Medicines:                Monitored Anesthesia Care Procedure:                Pre-Anesthesia Assessment:                           - Prior to the procedure, a History and Physical                            was performed, and patient medications and                            allergies were reviewed. The patient's tolerance of                            previous anesthesia was also reviewed. The risks                            and benefits of the procedure and the sedation                            options and risks were discussed with the patient.                            All questions were answered, and informed consent                            was obtained. Prior Anticoagulants: The patient has                            taken no previous anticoagulant or antiplatelet                            agents. ASA Grade Assessment: II - A patient with                            mild systemic disease. After reviewing the risks                            and benefits, the patient was deemed in                            satisfactory condition to undergo the procedure.                           After obtaining informed consent, the endoscope was  passed under direct vision. Throughout the                            procedure, the patient's blood pressure, pulse, and                            oxygen saturations were monitored continuously. The                            Endoscope was introduced through the mouth, and                            advanced to the second part of duodenum. The upper                            GI endoscopy was  accomplished without difficulty.                            The patient tolerated the procedure well. Scope In: Scope Out: Findings:                 The esophagus was normal.                           The stomach was normal.                           The examined duodenum was normal.                           The cardia and gastric fundus were normal on                            retroflexion. Complications:            No immediate complications. Estimated Blood Loss:     Estimated blood loss: none. Impression:               - Normal esophagus.                           - Normal stomach.                           - Normal examined duodenum.                           - No specimens collected. Recommendation:           - Patient has a contact number available for                            emergencies. The signs and symptoms of potential                            delayed complications were discussed with the  patient. Return to normal activities tomorrow.                            Written discharge instructions were provided to the                            patient.                           - Resume previous diet.                           - Follow antireflux measures long term.                           - Continue present medications including                            pantoprazole 40 mg po qd.                           - Return to GI office in 6 weeks. Meryl Dare, MD 12/05/2020 10:14:56 AM This report has been signed electronically.

## 2020-12-05 NOTE — Patient Instructions (Signed)
Please read handouts provided. Continue present medications. Continue pantoprazole 40 mg everyday. Return to GI office in 6 weeks.     YOU HAD AN ENDOSCOPIC PROCEDURE TODAY AT THE [facility]:   Refer to the procedure report that was given to you for any specific questions about what was found during the examination.  If the procedure report does not answer your questions, please call your gastroenterologist to clarify.  If you requested that your care partner not be given the details of your procedure findings, then the procedure report has been included in a sealed envelope for you to review at your convenience later.  YOU SHOULD EXPECT: Some feelings of bloating in the abdomen. Passage of more gas than usual.  Walking can help get rid of the air that was put into your GI tract during the procedure and reduce the bloating. If you had a lower endoscopy (such as a colonoscopy or flexible sigmoidoscopy) you may notice spotting of blood in your stool or on the toilet paper. If you underwent a bowel prep for your procedure, you may not have a normal bowel movement for a few days.  Please Note:  You might notice some irritation and congestion in your nose or some drainage.  This is from the oxygen used during your procedure.  There is no need for concern and it should clear up in a day or so.  SYMPTOMS TO REPORT IMMEDIATELY:     Following upper endoscopy (EGD)  Vomiting of blood or coffee ground material  New chest pain or pain under the shoulder blades  Painful or persistently difficult swallowing  New shortness of breath  Fever of 100F or higher  Black, tarry-looking stools  For urgent or emergent issues, a gastroenterologist can be reached at any hour by calling [phone]. Do not use MyChart messaging for urgent concerns.    DIET:  We do recommend a small meal at first, but then you may proceed to your regular diet.  Drink plenty of fluids but you should avoid  alcoholic beverages for 24 hours.  ACTIVITY:  You should plan to take it easy for the rest of today and you should NOT DRIVE or use heavy machinery until tomorrow (because of the sedation medicines used during the test).    FOLLOW UP: Our staff will call the number listed on your records 48-72 hours following your procedure to check on you and address any questions or concerns that you may have regarding the information given to you following your procedure. If we do not reach you, we will leave a message.  We will attempt to reach you two times.  During this call, we will ask if you have developed any symptoms of COVID 19. If you develop any symptoms (ie: fever, flu-like symptoms, shortness of breath, cough etc.) before then, please call [phone].  If you test positive for Covid 19 in the 2 weeks post procedure, please call and report this information to [place].    If any biopsies were taken you will be contacted by phone or by letter within the next 1-3 weeks.  Please call us at [phone] if you have not heard about the biopsies in 3 weeks.    SIGNATURES/CONFIDENTIALITY: You and/or your care partner have signed paperwork which will be entered into your electronic medical record.  These signatures attest to the fact that that the information above on your After Visit Summary has been reviewed and is understood.  Full responsibility of the  confidentiality of this discharge information lies with you and/or your care-partner. 

## 2020-12-05 NOTE — Progress Notes (Signed)
Lidocaine 100mg IV given to blunt gag reflex 

## 2020-12-07 ENCOUNTER — Telehealth: Payer: Self-pay

## 2020-12-07 NOTE — Telephone Encounter (Signed)
  Follow up Call-  Call back number 12/05/2020  Post procedure Call Back phone  # [phone]  Permission to leave phone message Yes  Some recent data might be hidden     Patient questions:  Do you have a fever, pain , or abdominal swelling? No. Pain Score  0 *  Have you tolerated food without any problems? Yes.    Have you been able to return to your normal activities? Yes.    Do you have any questions about your discharge instructions: Diet   No. Medications  No. Follow up visit  No.  Do you have questions or concerns about your Care? No.  Actions: * If pain score is 4 or above: No action needed, pain <4.  1. Have you developed a fever since your procedure? no  2.   Have you had an respiratory symptoms (SOB or cough) since your procedure? no  3.   Have you tested positive for COVID 19 since your procedure no  4.   Have you had any family members/close contacts diagnosed with the COVID 19 since your procedure?  no   If yes to any of these questions please route to Laverna Peace, RN and Karlton Lemon, RN

## 2020-12-24 ENCOUNTER — Other Ambulatory Visit: Payer: Self-pay | Admitting: Cardiovascular Disease

## 2021-01-16 NOTE — Progress Notes (Signed)
NEUROLOGY CONSULTATION NOTE  Jason Sampson MRN: [id number] DOB: 05-04-1981  Referring provider: Renford Dills, MD Primary care provider: Renford Dills, MD  Reason for consult:  headache  Assessment/Plan:   Intractable episodic headaches - characterization unclear for particular headache syndrome.  Probable migraine with status migrainosus possible.  Also consider episodic cluster - however headache diffuse and no autonomic symptoms (except briefly)  HTN.  1.  MRI of brain with and without contrast 2.  Patient defers starting preventative now.  If another episode occurs in the next 6 months, would start topiramate and prednisone taper. 3.  Otherwise, follow up 6 months. 4.  Follow up with PCP/cardiology regarding blood pressure   Subjective:  Jason Sampson is a 40 year old right-handed male with PAF (not on AC) and HTN who presents for headache.  History supplemented by referring provider's note.  In January, he had severe diffuse sharp/stabbing headaches (right worse than left).  Persistent 10/10 but every hour would increase to 8/9 and at night.  Severe pain would last 5-6 minutes.  In the morning he felt fine but then would start later in the morning.  Mild photophobia but no nausea, vomiting, visual disturbance, phonophobia, numbness and weakness.  No specific triggers.  He was taking ASA which was ineffective.  Went to UC and was treated for an ear infection.  No improvement.  Treated for allergies and no improvement.  Blood pressure was elevated, over 200/100.  In February, he eyes bloodshot and started having drainage from eyes and nose with sneezing and headache resolved.    Over the summer, he had similar headaches with high blood pressure.  His mother has history of cerebral aneurysms, so CTA of head was performed, which was personally reviewed, and was normal.    He had similar headache in 2018.  CT head at that time was also unremarkable.        PAST MEDICAL  HISTORY: Past Medical History:  Diagnosis Date  . Asthma   . GERD (gastroesophageal reflux disease)   . Hypercholesterolemia    PURE  . Hypertension   . Palpitations   . Seasonal allergies     PAST SURGICAL HISTORY: Past Surgical History:  Procedure Laterality Date  . FOOT SURGERY    . Lapidus Fusion Left 04/21/2013   @[facility]     MEDICATIONS: Current Outpatient Medications on File Prior to Visit  Medication Sig Dispense Refill  . albuterol (PROVENTIL) (2.5 MG/3ML) 0.083% nebulizer solution 3 ml as needed    . amLODipine (NORVASC) 10 MG tablet Take 10 mg by mouth daily.     atorvastatin (LIPITOR) 20 MG tablet Take 20 mg by mouth daily.    Marland Kitchen losartan (COZAAR) 25 MG tablet TAKE 1 TABLET BY MOUTH EVERY DAY 30 tablet 0  . pantoprazole (PROTONIX) 40 MG tablet Take 1 tablet (40 mg total) by mouth daily. 30 tablet 5   No current facility-administered medications on file prior to visit.    ALLERGIES: No Known Allergies  FAMILY HISTORY: Family History  Problem Relation Age of Onset  . Lupus Mother   . Sarcoidosis Mother   . Hypertension Mother   . CVA Mother   . Aneurysm Mother   . Atrial fibrillation Father   . Hypertension Father   . Migraines Sister   . Hypothyroidism Sister   . Colon cancer Neg Hx   . Esophageal cancer Neg Hx   . Stomach cancer Neg Hx   . Rectal cancer Neg Hx  Objective:  Blood pressure (!) 146/90, pulse 86, height 5\' 7"  (1.702 m), weight 223 lb (101.2 kg), SpO2 98 %. General: No acute distress.  Patient appears well-groomed.   Head:  Normocephalic/atraumatic Eyes:  fundi examined but not visualized Neck: supple, no paraspinal tenderness, full range of motion Back: No paraspinal tenderness Heart: regular rate and rhythm Lungs: Clear to auscultation bilaterally. Vascular: No carotid bruits. Neurological Exam: Mental status: alert and oriented to person, place, and time, recent and remote memory intact, fund of knowledge intact, attention and  concentration intact, speech fluent and not dysarthric, language intact. Cranial nerves: CN I: not tested CN II: pupils equal, round and reactive to light, visual fields intact CN III, IV, VI:  full range of motion, no nystagmus, no ptosis CN V: facial sensation intact. CN VII: upper and lower face symmetric CN VIII: hearing intact CN IX, X: gag intact, uvula midline CN XI: sternocleidomastoid and trapezius muscles intact CN XII: tongue midline Bulk & Tone: normal, no fasciculations. Motor:  muscle strength 5/5 throughout Sensation:  Pinprick, temperature and vibratory sensation intact. Deep Tendon Reflexes:  2+ throughout,  toes downgoing.   Finger to nose testing:  Without dysmetria.   Heel to shin:  Without dysmetria.   Gait:  Normal station and stride.  Romberg negative.    Thank you for allowing me to take part in the care of this patient.  , DO  CC:  Shon Millet, MD

## 2021-01-17 ENCOUNTER — Other Ambulatory Visit: Payer: Self-pay

## 2021-01-17 ENCOUNTER — Ambulatory Visit: Payer: BC Managed Care – PPO | Admitting: Neurology

## 2021-01-17 ENCOUNTER — Encounter: Payer: Self-pay | Admitting: Neurology

## 2021-01-17 VITALS — BP 146/90 | HR 86 | Ht 67.0 in | Wt 223.0 lb

## 2021-01-17 DIAGNOSIS — R519 Headache, unspecified: Secondary | ICD-10-CM

## 2021-01-17 DIAGNOSIS — G44219 Episodic tension-type headache, not intractable: Secondary | ICD-10-CM | POA: Diagnosis not present

## 2021-01-17 DIAGNOSIS — I10 Essential (primary) hypertension: Secondary | ICD-10-CM

## 2021-01-17 NOTE — Patient Instructions (Addendum)
1.  MRI of brain with and without contrast 2.  If headache returns, contact me and I will prescribe a prednisone taper and start a daily preventative (topiramate) 3.  Otherwise, follow up 6 months. 4.  If blood pressure elevated, follow up with cardiology or PCP

## 2021-02-02 ENCOUNTER — Ambulatory Visit
Admission: RE | Admit: 2021-02-02 | Discharge: 2021-02-02 | Disposition: A | Discharge status: Home / Self Care | Payer: BC Managed Care – PPO | Source: Ambulatory Visit | Attending: Neurology | Admitting: Neurology

## 2021-02-02 ENCOUNTER — Other Ambulatory Visit: Payer: Self-pay

## 2021-02-02 DIAGNOSIS — R519 Headache, unspecified: Secondary | ICD-10-CM

## 2021-02-02 IMAGING — MR MR HEAD WO/W CM
13 series · 48 of 48 positions shown · IV contrast (multihance)
Comparison: CTA head [DATE]. Report of noncontrast head CT
[DATE] (no images available).

CLINICAL DATA: 40-year-old male with persistent right side
headaches for 4 months. No known injury.

EXAM:
MRI HEAD WITHOUT AND WITH CONTRAST
TECHNIQUE: Multiplanar, multiecho pulse sequences of the brain and surrounding
structures were obtained without and with intravenous contrast.
CONTRAST:  20mL MULTIHANCE GADOBENATE DIMEGLUMINE 529 MG/ML IV SOLN

[Series 2: T1 · sagittal · 5.0mm · 0.45mm/px · 3 of 23 slices shown]
[im 1/23]
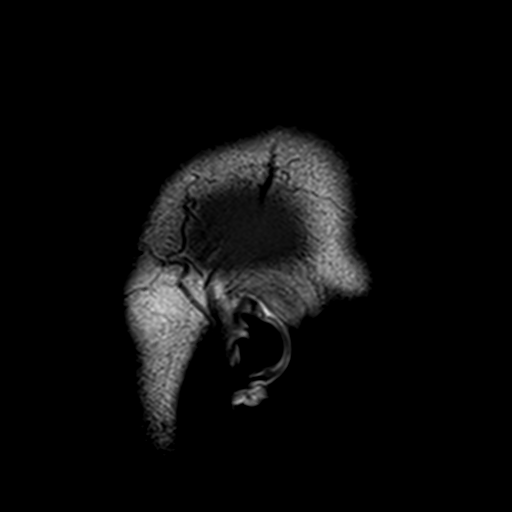
[im 12/23]
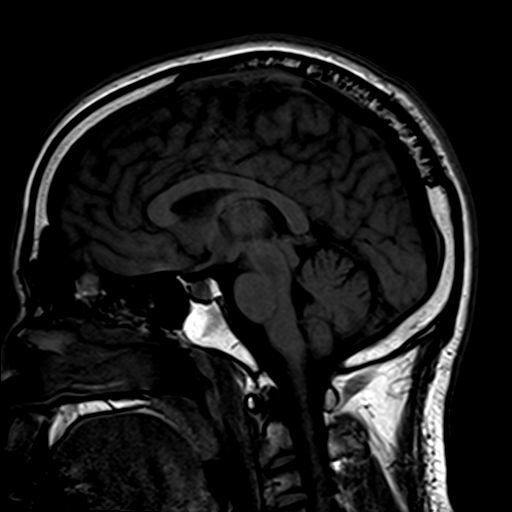
[im 23/23]
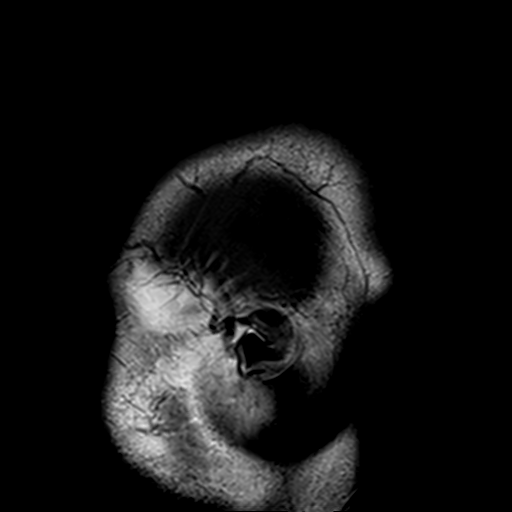

[Series 3: DWI · axial · 3.0mm · 1.80mm/px · z∈[-50,+96]mm · 7 of 100 slices shown (1 of 4)]
[im 1/100]
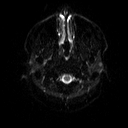
[im 17/100]
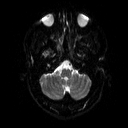
[im 34/100]
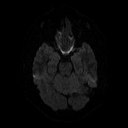
[im 50/100]
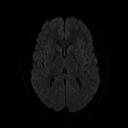
[im 67/100]
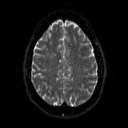
[im 83/100]
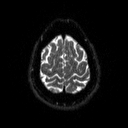
[im 100/100]
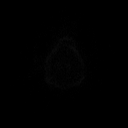

[Series 4: DWI · axial · 3.0mm · 1.80mm/px · z∈[-50,+96]mm · 3 of 50 slices shown (2 of 4)]
[im 1/50]
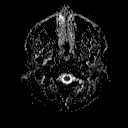
[im 25/50]
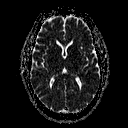
[im 50/50]
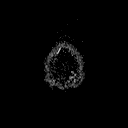

[Series 5: DWI · coronal · 5.0mm · 1.80mm/px · 5 of 72 slices shown (3 of 4)]
[im 1/72]
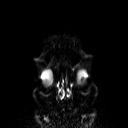
[im 18/72]
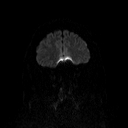
[im 36/72]
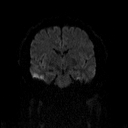
[im 54/72]
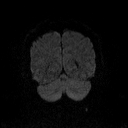
[im 72/72]
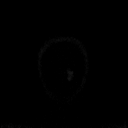

[Series 6: DWI · coronal · 5.0mm · 1.80mm/px · 2 of 36 slices shown (4 of 4)]
[im 1/36]
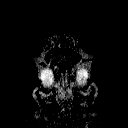
[im 36/36]
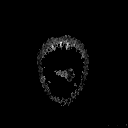

[Series 7: T2 · axial · 5.0mm · 0.60mm/px · 1 of 22 slices shown (1 of 2)]
[im 1/22]
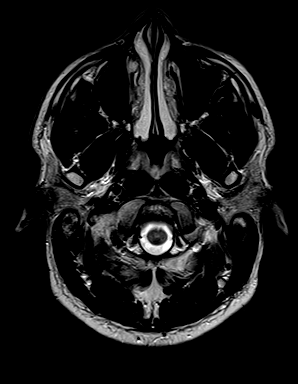

[Series 8: FLAIR · axial · 3.0mm · 0.45mm/px · z∈[-44,+90]mm · 2 of 30 slices shown]
[im 1/30]
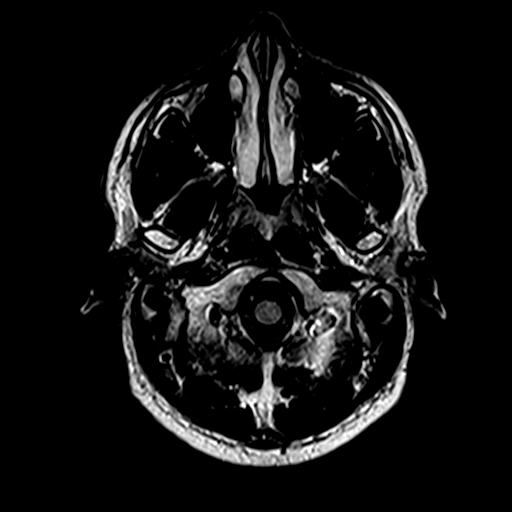
[im 30/30]
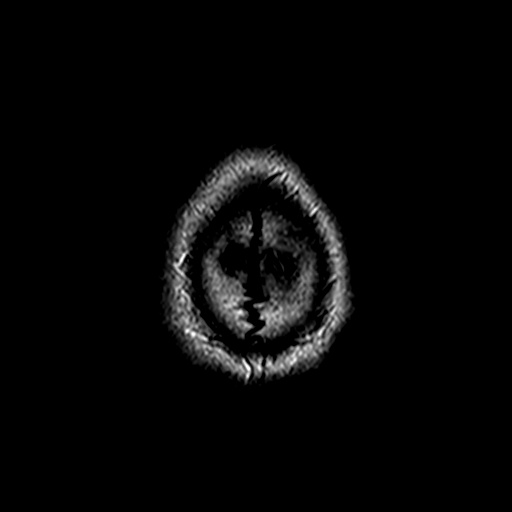

[Series 10: swi_images · axial · 4.0mm · 0.90mm/px · z∈[-46,+93]mm · 2 of 36 slices shown]
[im 1/36]
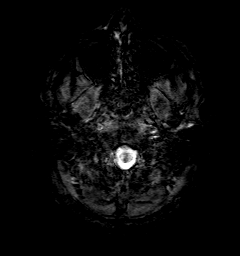
[im 36/36]
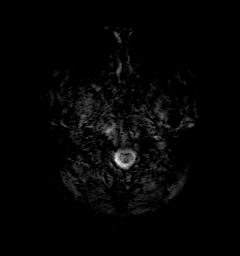

[Series 11: t1_mpr_tra · axial · 1.0mm · 0.75mm/px · z∈[-47,+96]mm · 9 of 144 slices shown]
[im 1/144]
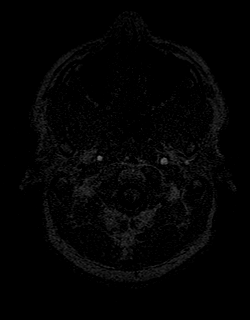
[im 18/144]
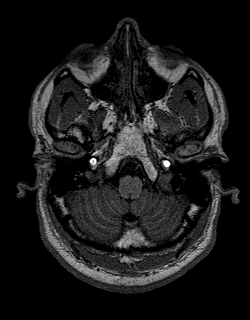
[im 36/144]
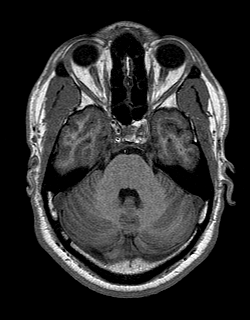
[im 54/144]
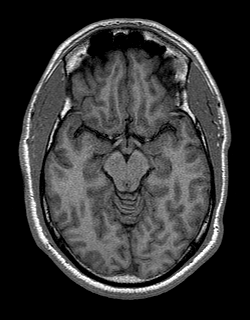
[im 72/144]
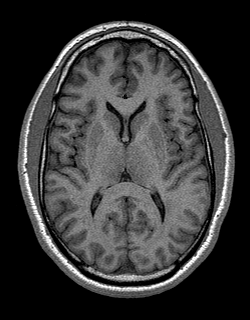
[im 90/144]
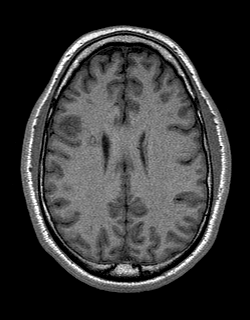
[im 108/144]
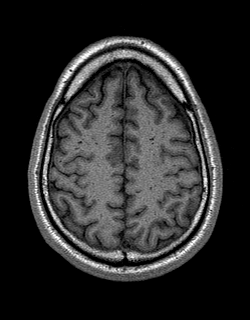
[im 126/144]
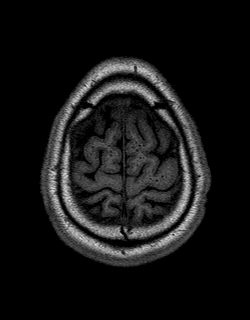
[im 144/144]
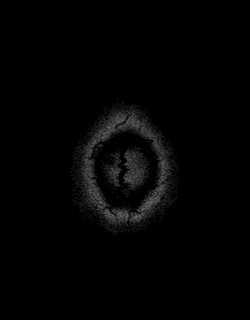

[Series 12: T2 · coronal · 5.0mm · 0.45mm/px · 2 of 28 slices shown (2 of 2)]
[im 1/28]
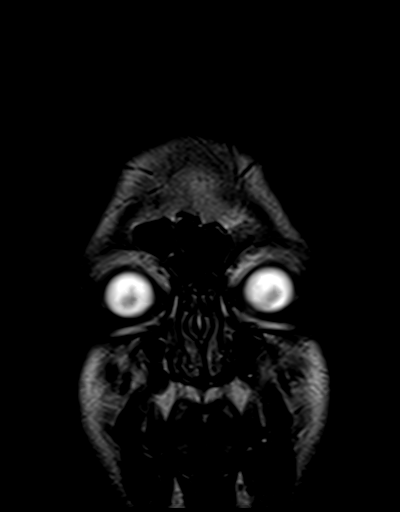
[im 28/28]
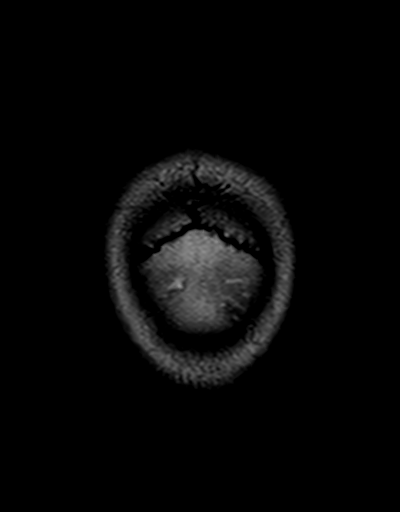

[Series 13: t1_mpr_tra post · axial · 1.0mm · 0.75mm/px · z∈[-47,+96]mm · 9 of 144 slices shown]
[im 1/144]
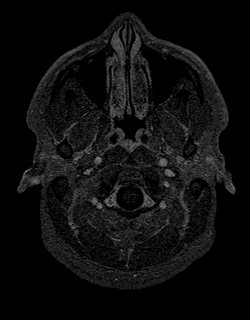
[im 18/144]
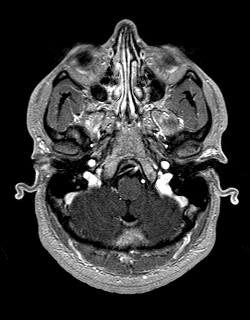
[im 36/144]
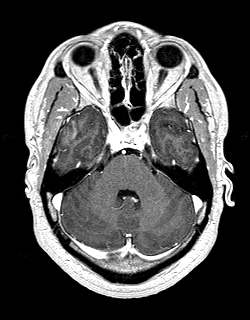
[im 54/144]
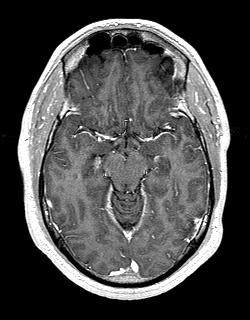
[im 72/144]
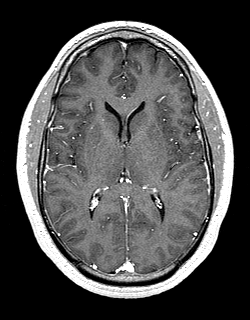
[im 90/144]
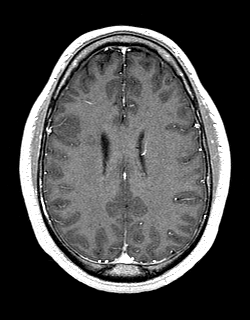
[im 108/144]
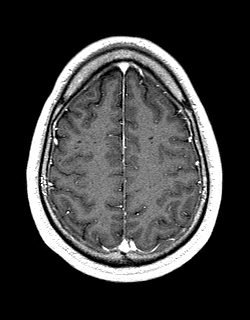
[im 126/144]
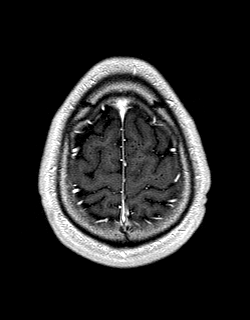
[im 144/144]
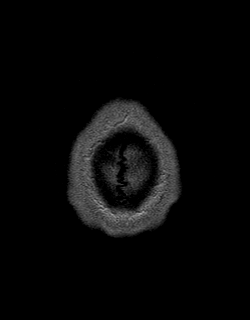

[Series 14: post cor · coronal · 5.0mm · 0.45mm/px · 2 of 28 slices shown]
[im 1/28]
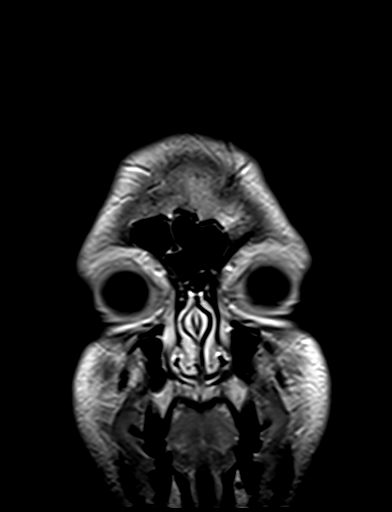
[im 28/28]
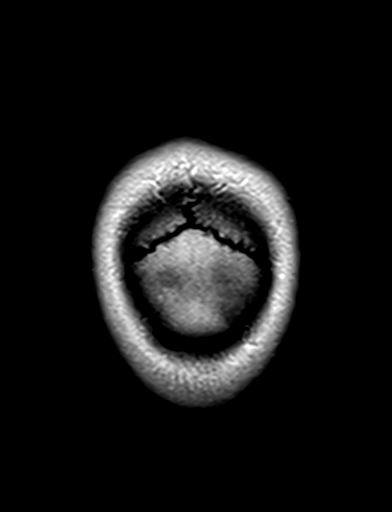

[Series 15: post sag (optional · sagittal · 5.0mm · 0.45mm/px · 1 of 21 slices shown]
[im 1/21]
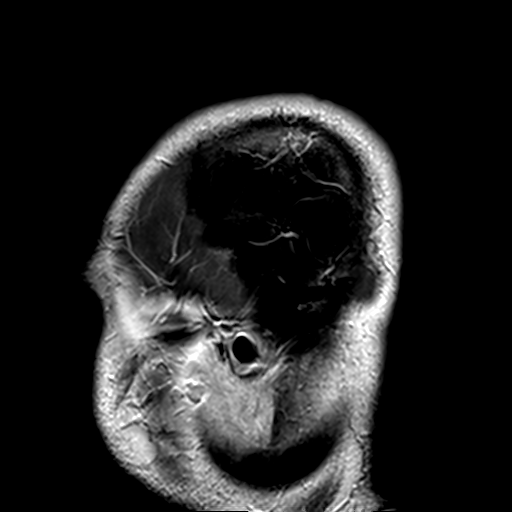

[48 of 48 positions shown; findings below may reference images not displayed]

FINDINGS: Brain: Normal cerebral volume. No restricted diffusion to suggest
acute infarction. No midline shift, mass effect, evidence of mass
lesion, ventriculomegaly, extra-axial collection or acute
intracranial hemorrhage. Cervicomedullary junction and pituitary are
within normal limits.

No cortical encephalomalacia or chronic cerebral blood products
identified. But patchy and scattered cerebral white matter T2 and
FLAIR hyperintensity (most pronounced in the right corona radiata
series 8, image 19) is advanced for age though nonspecific. This is
new from or was largely occult on the CT last year. Deep gray matter
nuclei, brainstem and cerebellum are within normal limits.

No abnormal enhancement identified.  No dural thickening.

Vascular: Major intracranial vascular flow voids are preserved. The
major dural venous sinuses are enhancing and appear to be patent.

Skull and upper cervical spine: Negative visible cervical spine.
Normal bone marrow signal.

Sinuses/Orbits: Negative orbits. Paranasal Visualized paranasal
sinuses and mastoids are stable and well aerated.

Other: Visible internal auditory structures appear normal. Negative
visible orbits and scalp.
IMPRESSION: 1. No acute intracranial abnormality.
2. Advanced for age but nonspecific cerebral white matter signal
changes, most pronounced in the right corona radiata. Top
differential considerations include accelerated small vessel
ischemia, demyelinating disease, sequelae of trauma, hypercoagulable
state, vasculitis, or prior infection.

## 2021-02-02 MED ORDER — GADOBENATE DIMEGLUMINE 529 MG/ML IV SOLN
20.0000 mL | Freq: Once | INTRAVENOUS | Status: AC | PRN
  Administered 2021-02-02: 20 mL via INTRAVENOUS

## 2021-02-07 ENCOUNTER — Telehealth: Payer: Self-pay

## 2021-02-07 NOTE — Telephone Encounter (Signed)
-----   Message from Adam R Jaffe, DO sent at 02/04/2021  3:56 PM EDT ----- MRI shows spots on the brain.  It is a nonspecific finding which is likely related to his history of high blood pressure.  However, I would like to check MRI of cervical spine with and without contrast to look for other causes of spots (reason for MRI of cervical spine - abnormal MRI of brain) 

## 2021-02-07 NOTE — Telephone Encounter (Signed)
Pt called to go over results no answer left a voice mail to call the office back  

## 2021-02-18 ENCOUNTER — Telehealth: Payer: Self-pay

## 2021-02-18 DIAGNOSIS — R9089 Other abnormal findings on diagnostic imaging of central nervous system: Secondary | ICD-10-CM

## 2021-02-18 NOTE — Telephone Encounter (Signed)
-----   Message from Drema Dallas, DO sent at 02/04/2021  3:56 PM EDT ----- MRI shows spots on the brain.  It is a nonspecific finding which is likely related to his history of high blood pressure.  However, I would like to check MRI of cervical spine with and without contrast to look for other causes of spots (reason for MRI of cervical spine - abnormal MRI of brain)

## 2021-02-18 NOTE — Telephone Encounter (Signed)
Pt advised of mri results pt to check MRI Cervical Spine.   Order added.

## 2021-03-08 ENCOUNTER — Other Ambulatory Visit: Payer: Self-pay

## 2021-03-08 ENCOUNTER — Ambulatory Visit
Admission: RE | Admit: 2021-03-08 | Discharge: 2021-03-08 | Disposition: A | Discharge status: Home / Self Care | Payer: BC Managed Care – PPO | Source: Ambulatory Visit | Attending: Neurology | Admitting: Neurology

## 2021-03-08 DIAGNOSIS — R9089 Other abnormal findings on diagnostic imaging of central nervous system: Secondary | ICD-10-CM

## 2021-03-08 IMAGING — MR MR CERVICAL SPINE WO/W CM
5 of 8 series · 28 of 48 positions shown · IV contrast (20ml Multihance)
Comparison: Previous brain MRI from [DATE].

CLINICAL DATA: Initial evaluation for abnormal MRI of the brain,
headaches, upper extremity sensitivity.

EXAM:
MRI CERVICAL SPINE WITHOUT AND WITH CONTRAST
TECHNIQUE: Multiplanar and multiecho pulse sequences of the cervical spine, to
include the craniocervical junction and cervicothoracic junction,
were obtained without and with intravenous contrast.
CONTRAST:  20mL MULTIHANCE GADOBENATE DIMEGLUMINE 529 MG/ML IV SOLN

[Series 3: T1 · sagittal · 3.0mm · 0.41mm/px · 4 of 13 slices shown (1 of 2)]
[im 1/13]
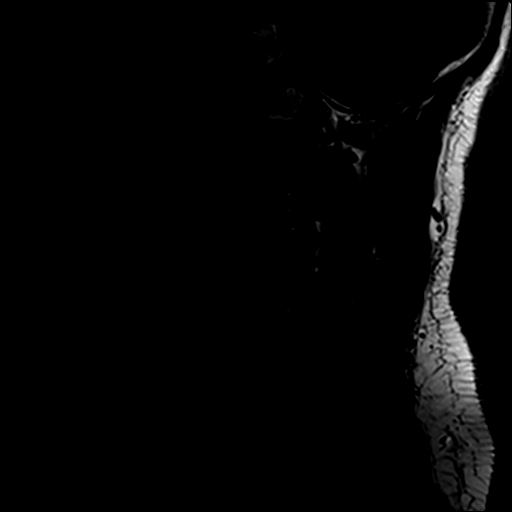
[im 5/13]
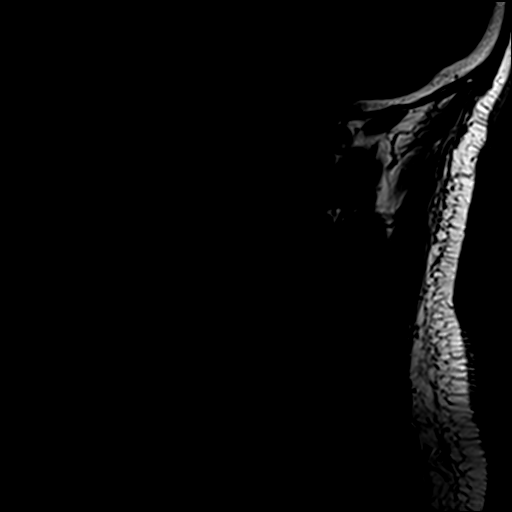
[im 9/13]
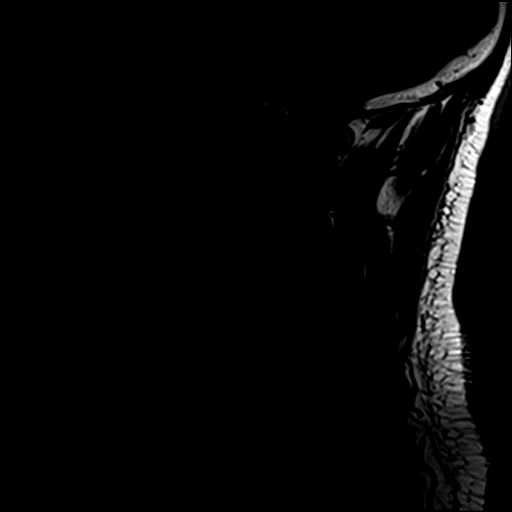
[im 13/13]
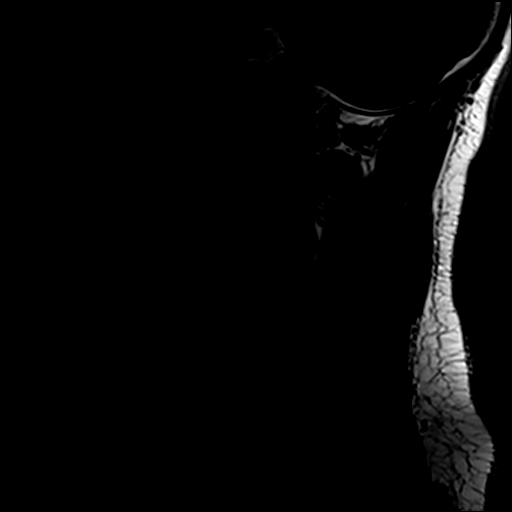

[Series 5: T2 · axial · 3.0mm · 0.94mm/px · z∈[-70,+32]mm · 8 of 28 slices shown (1 of 2)]
[im 1/28]
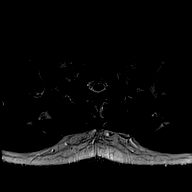
[im 4/28]
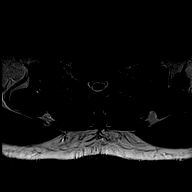
[im 8/28]
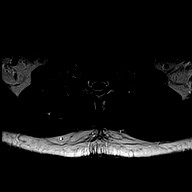
[im 12/28]
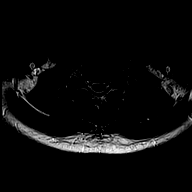
[im 16/28]
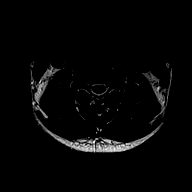
[im 20/28]
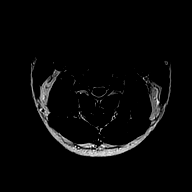
[im 24/28]
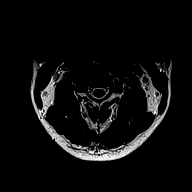
[im 28/28]
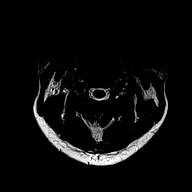

[Series 6: T1 · axial · 3.0mm · 0.35mm/px · z∈[-70,+32]mm · 8 of 28 slices shown (2 of 2)]
[im 1/28]
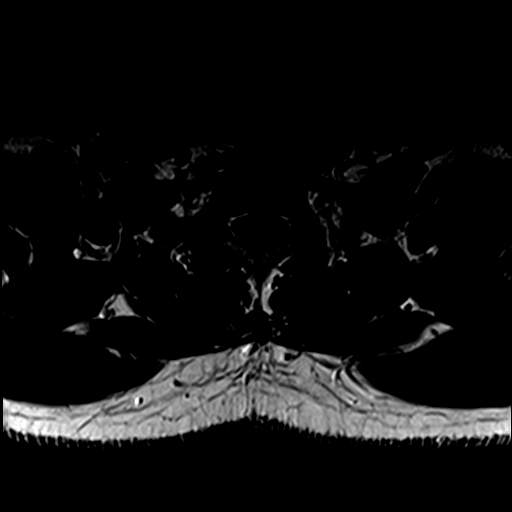
[im 4/28]
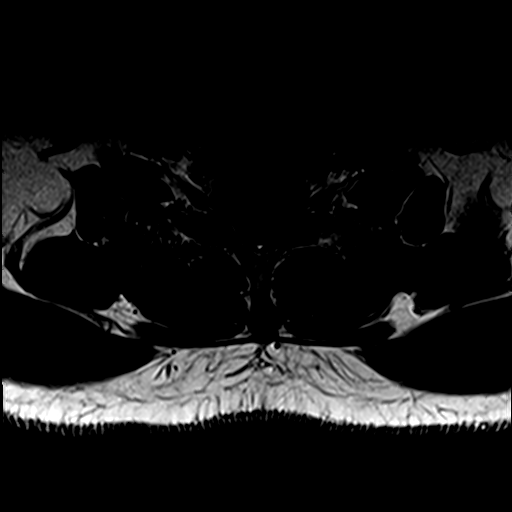
[im 8/28]
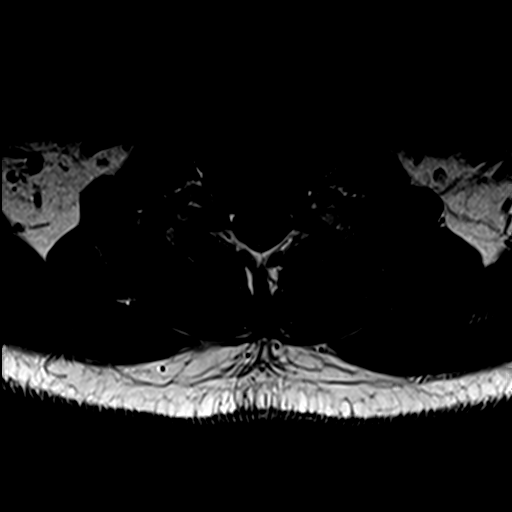
[im 12/28]
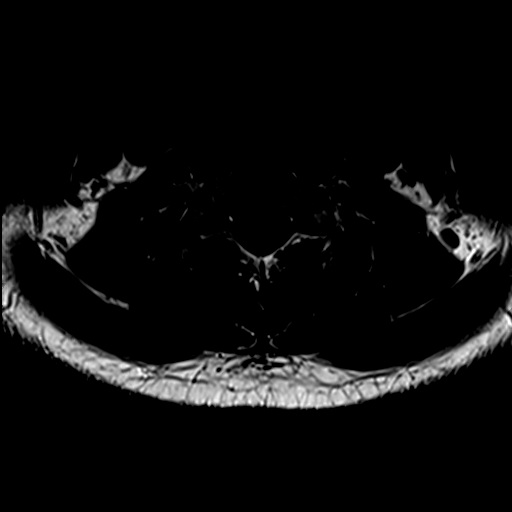
[im 16/28]
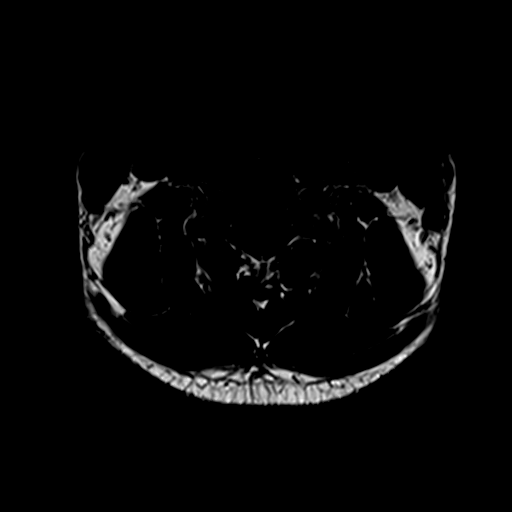
[im 20/28]
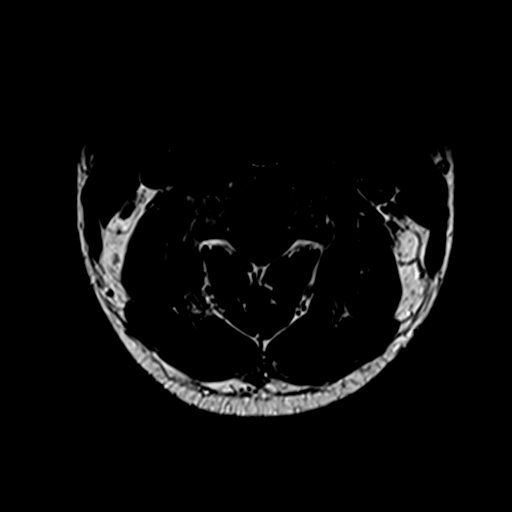
[im 24/28]
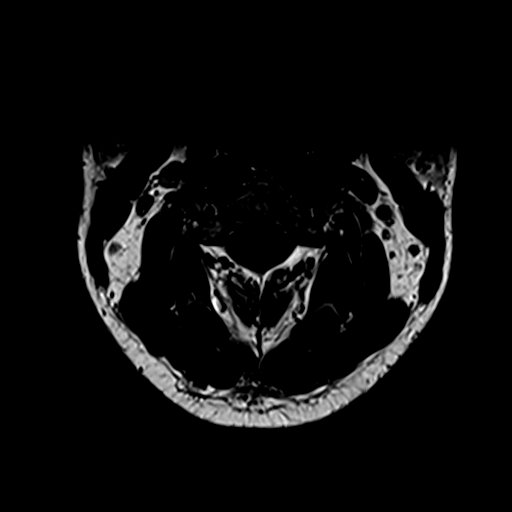
[im 28/28]
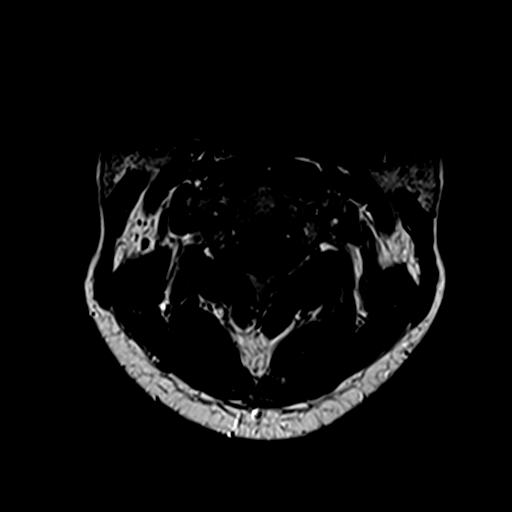

[Series 7: T2 · sagittal · 3.0mm · 0.41mm/px · 4 of 13 slices shown (2 of 2)]
[im 1/13]
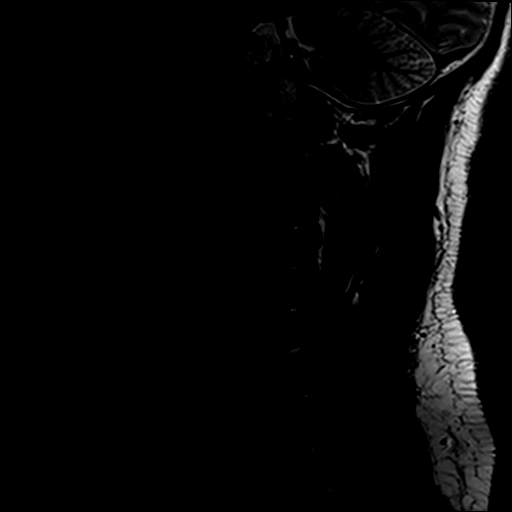
[im 5/13]
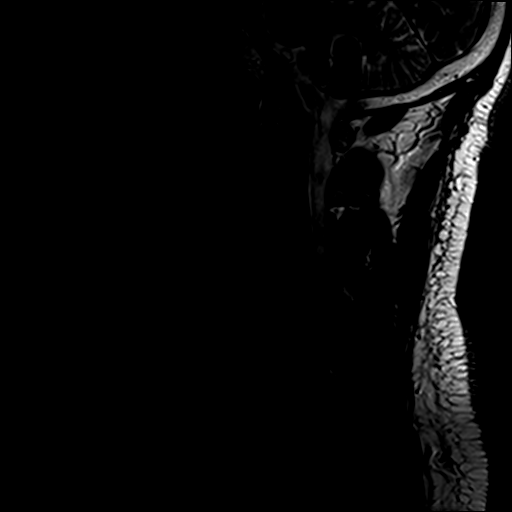
[im 9/13]
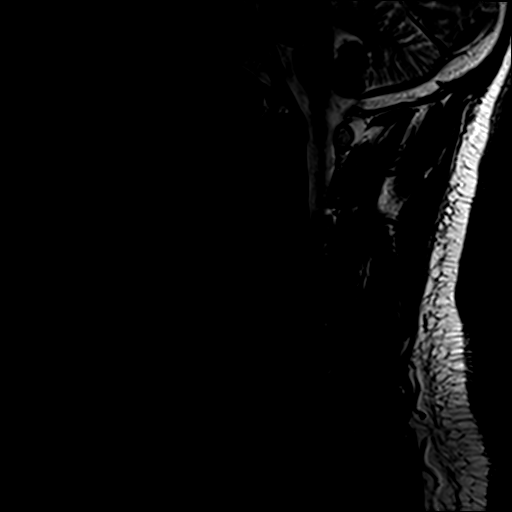
[im 13/13]
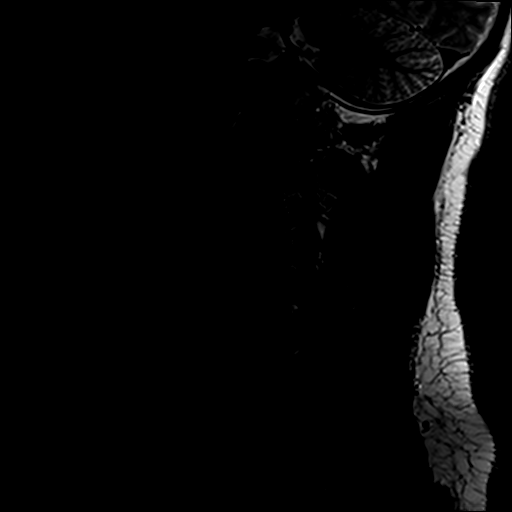

[Series 8: T1 fat-sat post-contrast · sagittal · 3.0mm · 0.82mm/px · 4 of 13 slices shown]
[im 1/13]
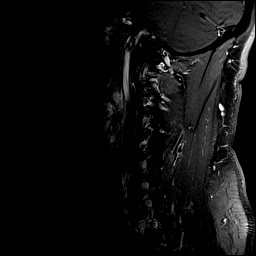
[im 5/13]
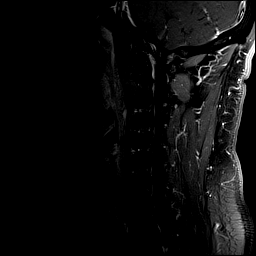
[im 9/13]
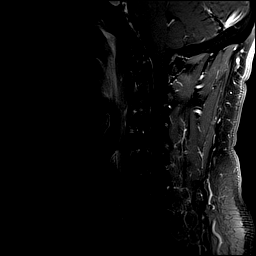
[im 13/13]
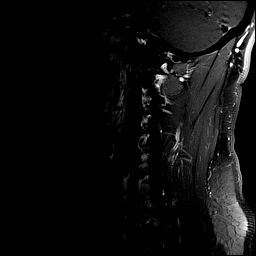

[28 of 48 positions shown; findings below may reference images not displayed]

FINDINGS: Alignment: Straightening of the normal cervical lordosis. No
listhesis.

Vertebrae: Vertebral body height maintained without acute or chronic
fracture. Bone marrow signal intensity within normal limits. No
discrete or worrisome osseous lesions. No abnormal marrow edema or
enhancement.

Cord: Normal signal and morphology. No cord signal changes to
suggest demyelinating disease. No abnormal enhancement.

Posterior Fossa, vertebral arteries, paraspinal tissues: Visualized
posterior fossa within normal limits. Craniocervical junction
normal. Paraspinous and prevertebral soft tissues normal. Normal
flow voids seen within the vertebral arteries bilaterally.

Disc levels:

C2-C3: Unremarkable.

C3-C4: Mild disc bulge with uncovertebral hypertrophy. No
significant spinal stenosis. Foramina remain patent.

C4-C5: Mild disc bulge with right-sided uncovertebral hypertrophy.
No spinal stenosis. Mild right C5 foraminal narrowing. No
significant left foraminal encroachment.

C5-C6: Mild disc bulge with right-sided uncovertebral hypertrophy.
Superimposed small left paracentral disc protrusion mildly indents
the left ventral thecal sac (series 5, image 17). No significant
spinal stenosis or cord deformity. Mild right C6 foraminal
narrowing. Left neural foramina remains patent.

C6-C7:  Minimal disc bulge.  No canal or foraminal stenosis.

C7-T1:  Unremarkable.

Visualized upper thoracic spine demonstrates no significant finding.
IMPRESSION: 1. Normal MRI appearance of the cervical spinal cord. No cord signal
changes to suggest demyelinating disease or other abnormality.
2. Mild cervical spondylosis at C3-4 through C6-7 without
significant spinal stenosis. Mild right C5 and C6 foraminal
narrowing related to disc bulge and uncovertebral disease. No other
significant foraminal encroachment.

## 2021-03-08 MED ORDER — GADOBENATE DIMEGLUMINE 529 MG/ML IV SOLN
20.0000 mL | Freq: Once | INTRAVENOUS | Status: AC | PRN
  Administered 2021-03-08: 20 mL via INTRAVENOUS

## 2021-03-14 NOTE — Progress Notes (Signed)
Mychart message sent.

## 2021-06-09 ENCOUNTER — Other Ambulatory Visit: Payer: Self-pay | Admitting: Physician Assistant

## 2021-10-07 ENCOUNTER — Emergency Department (HOSPITAL_COMMUNITY): Payer: BC Managed Care – PPO

## 2021-10-07 ENCOUNTER — Ambulatory Visit
Admission: EM | Admit: 2021-10-07 | Discharge: 2021-10-07 | Disposition: A | Discharge status: Home / Self Care | Payer: BC Managed Care – PPO

## 2021-10-07 ENCOUNTER — Encounter (HOSPITAL_BASED_OUTPATIENT_CLINIC_OR_DEPARTMENT_OTHER): Payer: Self-pay | Admitting: *Deleted

## 2021-10-07 ENCOUNTER — Emergency Department (HOSPITAL_BASED_OUTPATIENT_CLINIC_OR_DEPARTMENT_OTHER)
Admission: EM | Admit: 2021-10-07 | Discharge: 2021-10-07 | Disposition: A | Discharge status: Home / Self Care | Payer: BC Managed Care – PPO | Source: Home / Self Care | Attending: Emergency Medicine | Admitting: Emergency Medicine

## 2021-10-07 ENCOUNTER — Other Ambulatory Visit: Payer: Self-pay

## 2021-10-07 ENCOUNTER — Encounter (HOSPITAL_COMMUNITY): Payer: Self-pay | Admitting: Emergency Medicine

## 2021-10-07 ENCOUNTER — Emergency Department (HOSPITAL_COMMUNITY)
Admission: EM | Admit: 2021-10-07 | Discharge: 2021-10-07 | Discharge status: Against Medical Advice | Payer: BC Managed Care – PPO | Attending: Emergency Medicine | Admitting: Emergency Medicine

## 2021-10-07 DIAGNOSIS — G43811 Other migraine, intractable, with status migrainosus: Secondary | ICD-10-CM

## 2021-10-07 DIAGNOSIS — I10 Essential (primary) hypertension: Secondary | ICD-10-CM | POA: Insufficient documentation

## 2021-10-07 DIAGNOSIS — J45909 Unspecified asthma, uncomplicated: Secondary | ICD-10-CM | POA: Insufficient documentation

## 2021-10-07 DIAGNOSIS — R519 Headache, unspecified: Secondary | ICD-10-CM | POA: Diagnosis present

## 2021-10-07 DIAGNOSIS — Z79899 Other long term (current) drug therapy: Secondary | ICD-10-CM | POA: Insufficient documentation

## 2021-10-07 DIAGNOSIS — M542 Cervicalgia: Secondary | ICD-10-CM

## 2021-10-07 DIAGNOSIS — H5711 Ocular pain, right eye: Secondary | ICD-10-CM

## 2021-10-07 DIAGNOSIS — Z5321 Procedure and treatment not carried out due to patient leaving prior to being seen by health care provider: Secondary | ICD-10-CM | POA: Insufficient documentation

## 2021-10-07 IMAGING — CT CT HEAD W/O CM
4 series · 17 of 47 positions shown, 19 images · non-contrast
Comparison: None.

CLINICAL DATA: Headaches

EXAM:
CT HEAD WITHOUT CONTRAST
TECHNIQUE: Contiguous axial images were obtained from the base of the skull
through the vertex without intravenous contrast.

[Series 3: head without · axial · non-contrast · 0.49mm/px · z∈[-19,+101]mm · 7 of 34 slices shown, 9 images]
[im 5/34  brain]
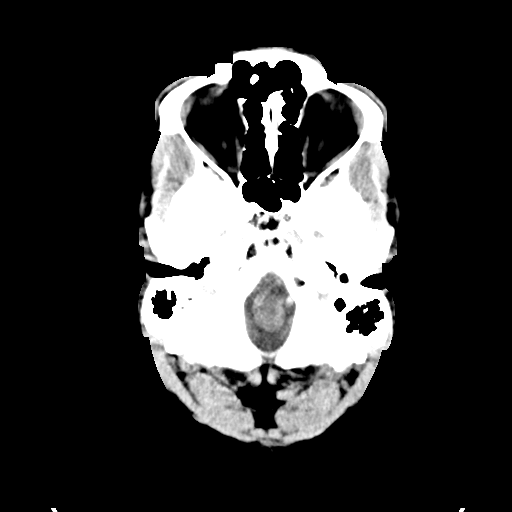
[im 5/34  bone]
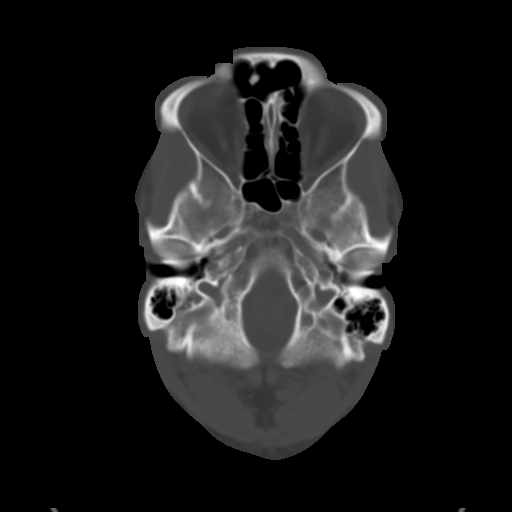
[im 9/34  brain]
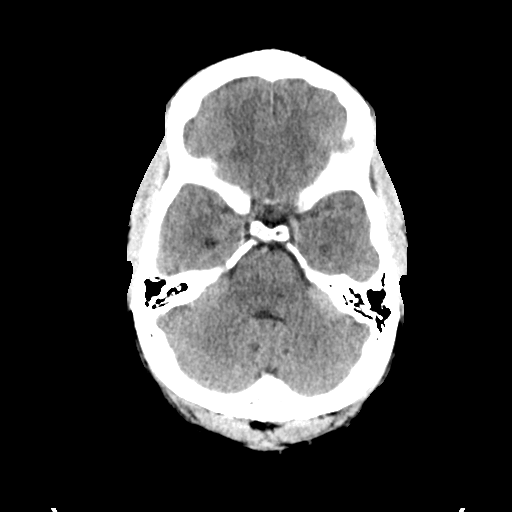
[im 13/34  brain]
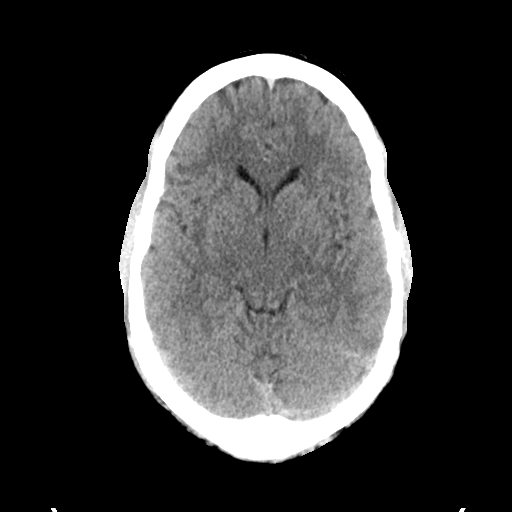
[im 17/34  brain]
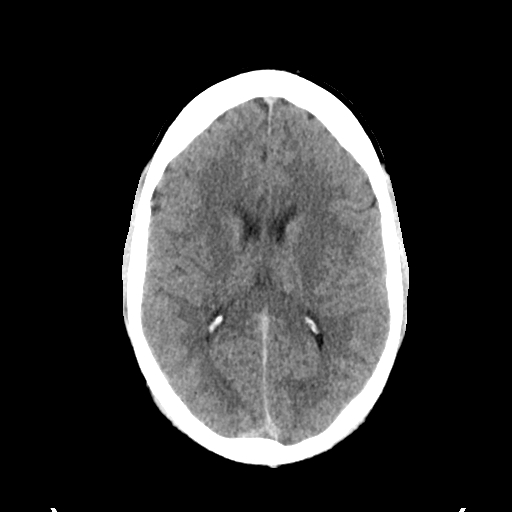
[im 21/34  brain]
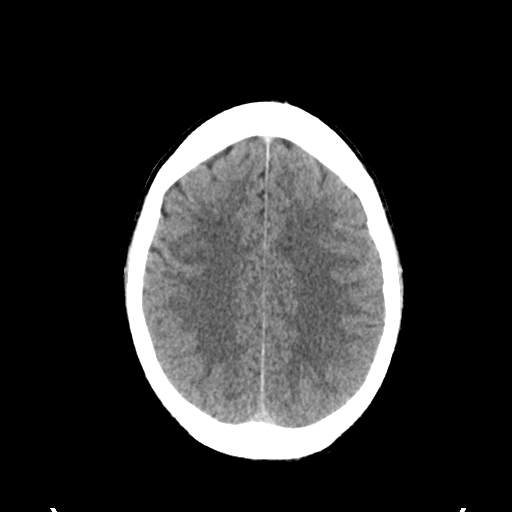
[im 21/34  bone]
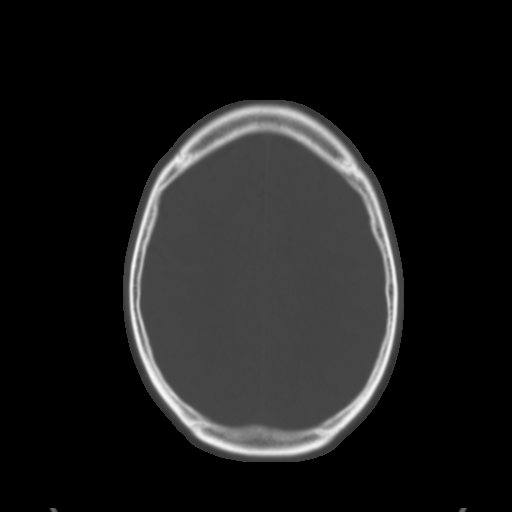
[im 25/34  brain]
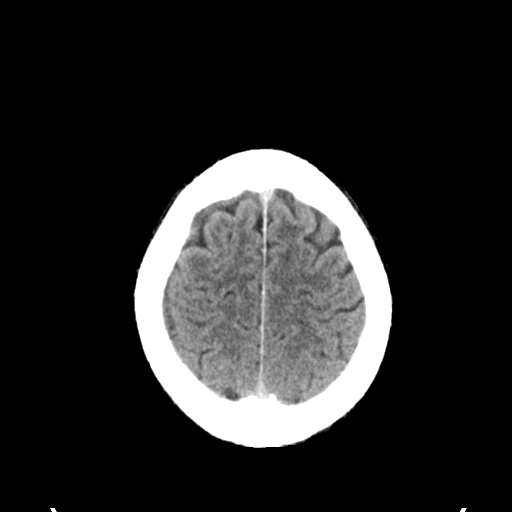
[im 29/34  brain]
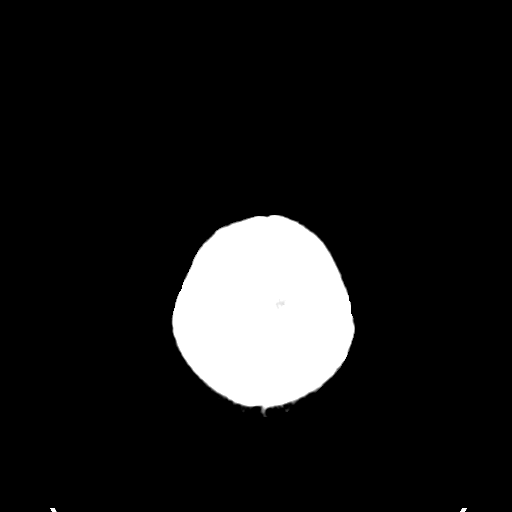

[Series 4: head bone · axial · 0.49mm/px · z∈[-23,+35]mm · 4 of 84 slices shown]
[im 9/84  bone]
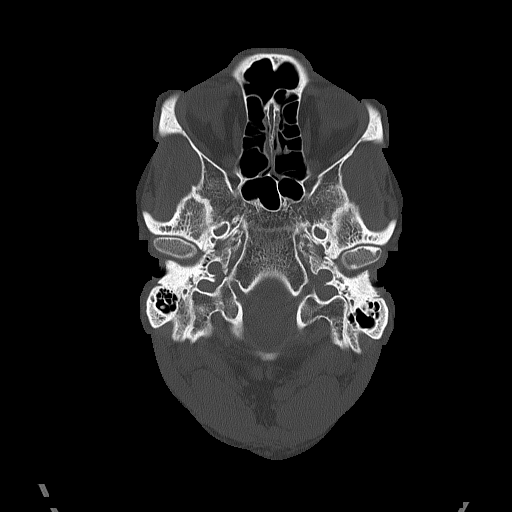
[im 17/84  bone]
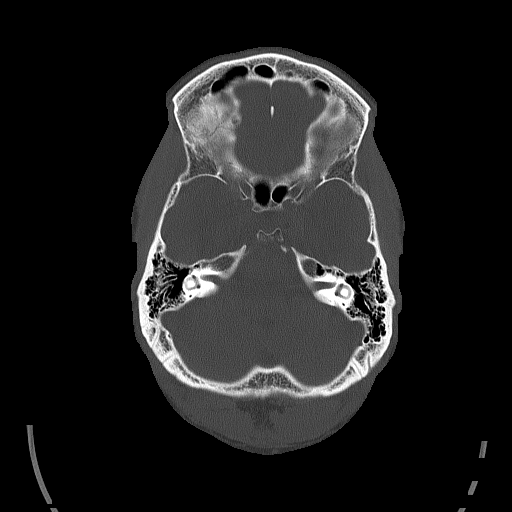
[im 25/84  bone]
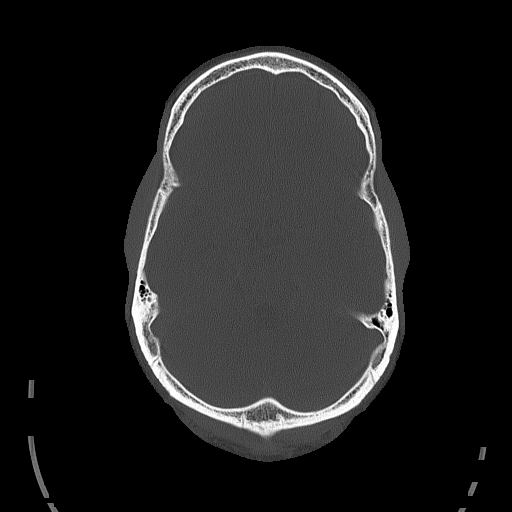
[im 38/84  bone]
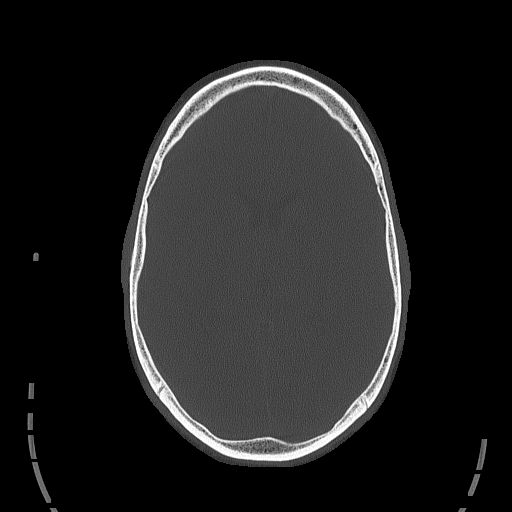

[Series 5: head without cor · coronal · non-contrast · 0.31mm/px · 3 of 75 slices shown]
[im 25/75  brain]
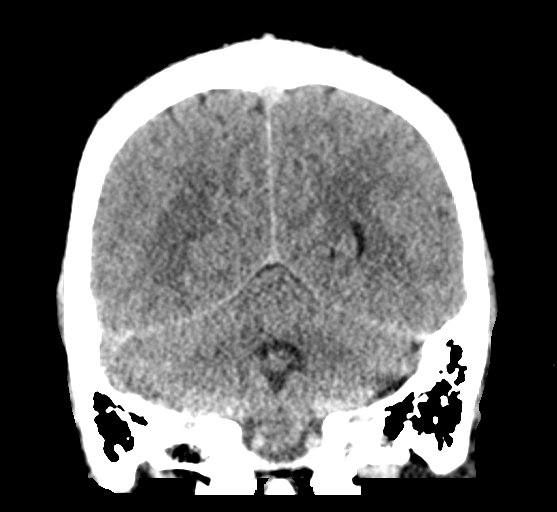
[im 33/75  brain]
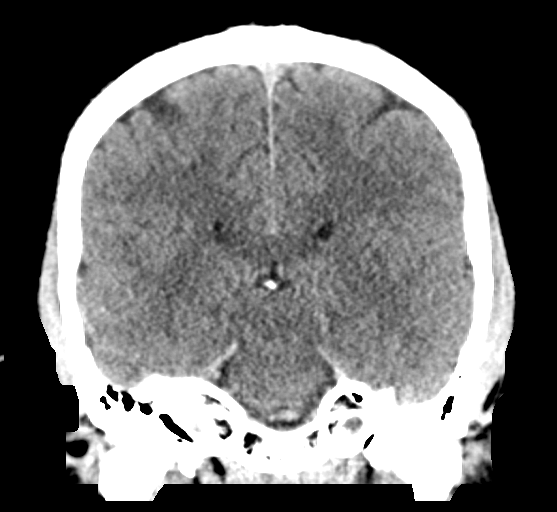
[im 42/75  brain]
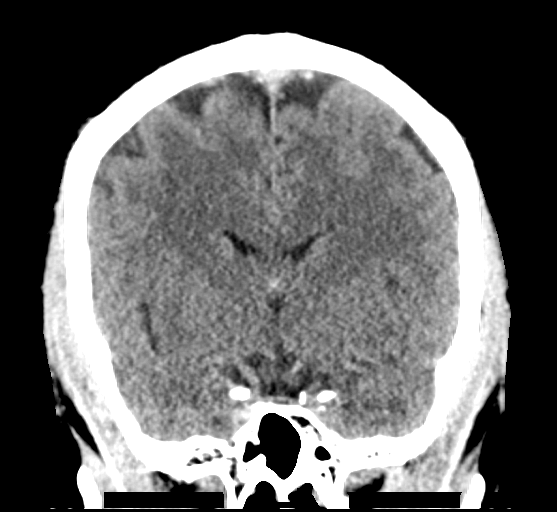

[Series 6: head without sag · sagittal · non-contrast · 0.33mm/px · 3 of 67 slices shown]
[im 23/67  brain]
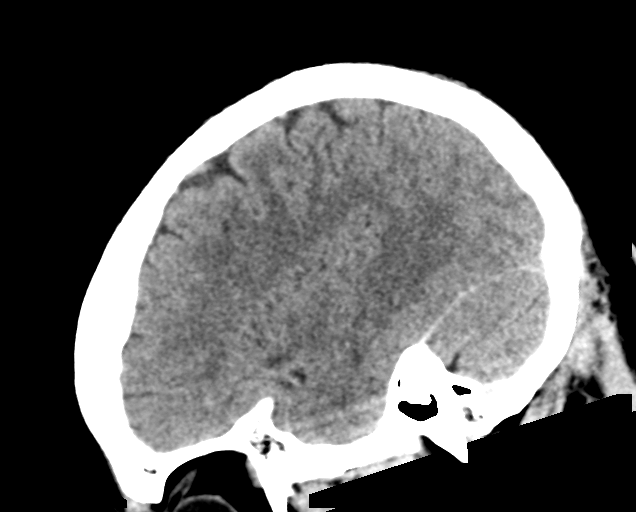
[im 34/67  brain]
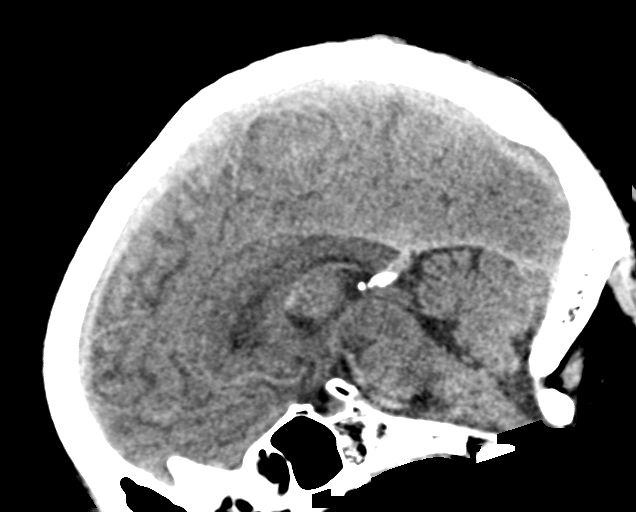
[im 45/67  brain]
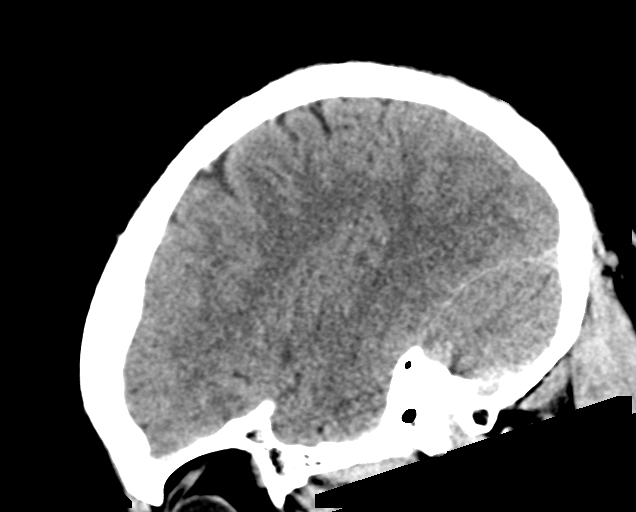

[17 of 47 positions shown; findings below may reference images not displayed]

FINDINGS: Brain: No acute intracranial findings are seen. There are no signs
of bleeding. There is no focal mass effect. Ventricles are not
dilated.

Vascular: Unremarkable.

Skull: Unremarkable.

Sinuses/Orbits: Unremarkable.

Other: None
IMPRESSION: No acute intracranial findings are seen in noncontrast CT brain.

## 2021-10-07 MED ORDER — LACTATED RINGERS IV BOLUS
1000.0000 mL | Freq: Once | INTRAVENOUS | Status: AC
  Administered 2021-10-07: 1000 mL via INTRAVENOUS

## 2021-10-07 MED ORDER — KETOROLAC TROMETHAMINE 15 MG/ML IJ SOLN
15.0000 mg | Freq: Once | INTRAMUSCULAR | Status: AC
  Administered 2021-10-07: 15 mg via INTRAVENOUS
  Filled 2021-10-07: qty 1

## 2021-10-07 MED ORDER — METOCLOPRAMIDE HCL 5 MG/ML IJ SOLN
10.0000 mg | Freq: Once | INTRAMUSCULAR | Status: AC
  Administered 2021-10-07: 10 mg via INTRAVENOUS
  Filled 2021-10-07: qty 2

## 2021-10-07 NOTE — ED Provider Notes (Signed)
[facility]ENC[facility] DEPARTMENT Provider Note   CSN: [id number] Arrival date & time: 10/07/21  1816     History  Chief Complaint  Patient presents with   Headache    Jason Sampson is a 41 y.o. male.  Patient is a 41 year old male with a history of asthma, GERD, hypertension, prior headaches with MRI that showed lesions that were not worrisome per the patient who follows up with neurology who is presenting today with complaint of headache.  He reports the headache started about 2 days ago.  It started behind in his whole head and behind his eyes.  It has been gradually building over the last 2 days.  He feels now that there is significant pain be behind his right eye and some pulsation.  He has not had any visual changes, nausea or vomiting.  He reports the pain in his head is now moved to also involve the back of his neck on both sides.  He has not had any fever, congestion, cough.  He denies any chest pain or shortness of breath.  No unilateral numbness or weakness.  No significant photophobia.  He reports he has had headaches like this 1 other time before but it was more severe.  He has not taken any medication for pain  The history is provided by the patient.  Headache     Home Medications Prior to Admission medications   Medication Sig Start Date End Date Taking? Authorizing Provider  albuterol (PROVENTIL) (2.5 MG/3ML) 0.083% nebulizer solution 3 ml as needed    [provider]  amLODipine (NORVASC) 10 MG tablet Take 10 mg by mouth daily.     [provider]  atorvastatin (LIPITOR) 20 MG tablet Take 20 mg by mouth daily. 03/24/20   [provider]  losartan (COZAAR) 25 MG tablet TAKE 1 TABLET BY MOUTH EVERY DAY 12/24/20   O'Neal, Cassie Freer, MD  pantoprazole (PROTONIX) 40 MG tablet TAKE 1 TABLET BY MOUTH EVERY DAY 06/10/21   Levin Erp, PA      Allergies    Patient has no known allergies.    Review of Systems   Review of  Systems  Neurological:  Positive for headaches.   Physical Exam Updated Vital Signs BP (!) 134/96    Pulse 71    Temp 97.9 F (36.6 C) (Oral)    Resp 18    Ht 5\' 7"  (1.702 m)    Wt 101.2 kg    SpO2 100%    BMI 34.94 kg/m  Physical Exam Vitals and nursing note reviewed.  Constitutional:      General: He is not in acute distress.    Appearance: He is well-developed.  HENT:     Head: Normocephalic and atraumatic.     Comments: No temporal pain    Right Ear: Tympanic membrane normal.     Left Ear: Tympanic membrane normal.  Eyes:     Extraocular Movements: Extraocular movements intact.     Conjunctiva/sclera: Conjunctivae normal.     Pupils: Pupils are equal, round, and reactive to light.  Cardiovascular:     Rate and Rhythm: Normal rate and regular rhythm.     Heart sounds: No murmur heard. Pulmonary:     Effort: Pulmonary effort is normal. No respiratory distress.     Breath sounds: Normal breath sounds. No wheezing or rales.  Abdominal:     General: There is no distension.     Palpations: Abdomen is soft.  Tenderness: There is no abdominal tenderness. There is no guarding or rebound.  Musculoskeletal:        General: No tenderness. Normal range of motion.     Cervical back: Normal range of motion and neck supple. No tenderness. No spinous process tenderness or muscular tenderness.  Skin:    General: Skin is warm and dry.     Findings: No erythema or rash.  Neurological:     Mental Status: He is alert and oriented to person, place, and time. Mental status is at baseline.     Cranial Nerves: No cranial nerve deficit.     Sensory: No sensory deficit.     Motor: No weakness.  Psychiatric:        Mood and Affect: Mood normal.        Behavior: Behavior normal.    ED Results / Procedures / Treatments   Labs (all labs ordered are listed, but only abnormal results are displayed) Labs Reviewed - No data to display  EKG None  Radiology CT Head Wo Contrast  Result  Date: 10/07/2021 CLINICAL DATA:  Headaches EXAM: CT HEAD WITHOUT CONTRAST TECHNIQUE: Contiguous axial images were obtained from the base of the skull through the vertex without intravenous contrast. COMPARISON:  None. FINDINGS: Brain: No acute intracranial findings are seen. There are no signs of bleeding. There is no focal mass effect. Ventricles are not dilated. Vascular: Unremarkable. Skull: Unremarkable. Sinuses/Orbits: Unremarkable. Other: None IMPRESSION: No acute intracranial findings are seen in noncontrast CT brain. Electronically Signed   By: Elmer Picker M.D.   On: 10/07/2021 11:43    Procedures Procedures    Medications Ordered in ED Medications  metoCLOPramide (REGLAN) injection 10 mg (10 mg Intravenous Given 10/07/21 2244)  ketorolac (TORADOL) 15 MG/ML injection 15 mg (15 mg Intravenous Given 10/07/21 2242)  lactated ringers bolus 1,000 mL (1,000 mLs Intravenous New Bag/Given 10/07/21 2235)    ED Course/ Medical Decision Making/ A&P                           Medical Decision Making Amount and/or Complexity of Data Reviewed External Data Reviewed: notes.   Pt with symptoms suggestive of migraine HA without sx suggestive of SAH(sudden onset, worst of life, or deficits), infection, or cavernous vein thrombosis.  Normal neuro exam and vital signs.pt had head CT done prior to arrival here which I independently evaluated and radiology read was negative for acute findings. Will give HA cocktail and on re-eval.  11:24 PM On reevaluation patient's headache is improved.  I discussed the CAT scan findings with him and his vital signs.  His blood pressure is improving and he is taking his medication regularly.  However given his prior MRI with lesions and follow-up with neurology encouraged him to follow-up with neurology if he continues to have headaches as he may need further evaluation.  He was given return precautions.  Low suspicion for an aneurysm at this time causing his  symptoms.        Final Clinical Impression(s) / ED Diagnoses Final diagnoses:  Other migraine with status migrainosus, intractable    Rx / DC Orders ED Discharge Orders     None         Blanchie Dessert, MD 10/07/21 2326

## 2021-10-07 NOTE — Discharge Instructions (Addendum)
Please report to the hospital now as you are in need of a higher level of care than we can provide in the urgent care setting. I am concerned that you will need a head CT scan to rule out acute encephalopathy, stroke, aneurysm given your persistent and severe headache, high blood pressure. Please go straight to the hospital now.

## 2021-10-07 NOTE — ED Provider Notes (Signed)
[facility] - URGENT CARE CENTER   MRN: [id number] DOB: 1981/03/24  Subjective:   Jason Sampson is a 41 y.o. male presenting for 2 to 3-day history of acute onset persistent right-sided headache that is now severe, rated 8 out of 10.  He is also had pain of the right eye and develop pain on the left side of his posterior neck.  No confusion, weakness, numbness or tingling, double vision, photophobia, chest pain, shortness of breath, heart racing, nausea, vomiting, jaw pain, abdominal pain.  He is not a drinker, not a smoker.  Dad had atrial fibrillation. Mom has had 2 aneurysm, 1 stroke.   No current facility-administered medications for this encounter.  Current Outpatient Medications:    albuterol (PROVENTIL) (2.5 MG/3ML) 0.083% nebulizer solution, 3 ml as needed, Disp: , Rfl:    amLODipine (NORVASC) 10 MG tablet, Take 10 mg by mouth daily. , Disp: , Rfl:    atorvastatin (LIPITOR) 20 MG tablet, Take 20 mg by mouth daily., Disp: , Rfl:    losartan (COZAAR) 25 MG tablet, TAKE 1 TABLET BY MOUTH EVERY DAY, Disp: 30 tablet, Rfl: 0   pantoprazole (PROTONIX) 40 MG tablet, TAKE 1 TABLET BY MOUTH EVERY DAY, Disp: 90 tablet, Rfl: 1   No Known Allergies  Past Medical History:  Diagnosis Date   Asthma    GERD (gastroesophageal reflux disease)    Hypercholesterolemia    PURE   Hypertension    Palpitations    Seasonal allergies      Past Surgical History:  Procedure Laterality Date   FOOT SURGERY     Lapidus Fusion Left 04/21/2013   @[facility]     Family History  Problem Relation Age of Onset   Lupus Mother    Sarcoidosis Mother    Hypertension Mother    CVA Mother    Aneurysm Mother    Atrial fibrillation Father    Hypertension Father    Migraines Sister    Hypothyroidism Sister    Colon cancer Neg Hx    Esophageal cancer Neg Hx    Stomach cancer Neg Hx    Rectal cancer Neg Hx     Social History   Tobacco Use   Smoking status: Never   Smokeless tobacco: Never  Vaping  Use   Vaping Use: Never used  Substance Use Topics   Alcohol use: No   Drug use: No    ROS   Objective:   Vitals: BP (!) 158/103 (BP Location: Left Arm)    Pulse 93    Temp 98.4 F (36.9 C)    Resp 16    SpO2 100%   BP Readings from Last 3 Encounters:  10/07/21 (!) 158/103  01/17/21 (!) 146/90  12/05/20 137/83   Physical Exam Constitutional:      General: He is not in acute distress.    Appearance: Normal appearance. He is well-developed. He is not ill-appearing, toxic-appearing or diaphoretic.  HENT:     Head: Normocephalic and atraumatic.     Right Ear: External ear normal.     Left Ear: External ear normal.     Nose: Nose normal.     Mouth/Throat:     Mouth: Mucous membranes are moist.  Eyes:     General: No scleral icterus.       Right eye: No discharge.        Left eye: No discharge.     Extraocular Movements: Extraocular movements intact.     Conjunctiva/sclera: Conjunctivae normal.  Pupils: Pupils are equal, round, and reactive to light.  Neck:     Meningeal: Brudzinski's sign and Kernig's sign absent.  Cardiovascular:     Rate and Rhythm: Normal rate and regular rhythm.     Heart sounds: Normal heart sounds. No murmur heard.   No friction rub. No gallop.  Pulmonary:     Effort: Pulmonary effort is normal. No respiratory distress.     Breath sounds: Normal breath sounds. No stridor. No wheezing, rhonchi or rales.  Musculoskeletal:     Cervical back: Normal range of motion and neck supple. No rigidity.  Neurological:     Mental Status: He is alert and oriented to person, place, and time.     Cranial Nerves: No cranial nerve deficit, dysarthria or facial asymmetry.     Motor: No weakness.     Coordination: Romberg sign negative. Coordination normal.     Gait: Gait normal.  Psychiatric:        Mood and Affect: Mood normal.        Behavior: Behavior normal.        Thought Content: Thought content normal.        Judgment: Judgment normal.     Assessment and Plan :   PDMP not reviewed this encounter.  1. Right-sided headache   2. Acute right eye pain   3. Neck pain on left side   4. Essential hypertension    Patient is in need of a higher level of care than we can provide in the urgent care setting.  My concern is for an acute encephalopathy and advised that he present to the emergency room now.  He declined transport by EMS.  I am in agreement as he does not have signs of stroke on exam or any significant neurologic findings.  However his family history is very concerning for aneurysm and stroke.  Patient contracts for safety and will report to the hospital now.   Wallis Bamberg, [place] 10/07/21 [phone]

## 2021-10-07 NOTE — ED Provider Triage Note (Signed)
Emergency Medicine Provider Triage Evaluation Note  Jason Sampson , a 41 y.o. male  was evaluated in triage.  Pt complains of headaches.  They have been going on for about a week, worsened in the last 2 days.  He has history of headaches that this feels different because he is having a pulsating behind his right eye.  He does have occasional floaters in his vision but that is not new.  Denies any nausea, vomiting.  He has pain in the posterior of his neck along his posterior occiput, no pain to the anterior of his neck.  Headache does not worsen with any exertion, or have any chest pain shortness of breath.  Has not had any lightheadedness or presyncopal episodes.  Patient has a family history of a mother who had an aneurysm in her 101s, patient does not have history of this.  He is followed by neurology for frequent headaches.  Seen earlier at urgent care to be worked up for "acute encephalopathy"..  Review of Systems  Positive: Headache Negative: Syncope  Physical Exam  BP (!) 144/96 (BP Location: Right Arm)    Pulse 77    Temp 98.9 F (37.2 C) (Oral)    Resp 16    SpO2 100%  Gen:   Awake, no distress   Resp:  Normal effort  MSK:   Moves extremities without difficulty  Other:  Cranial nerves III through XII are grossly intact.  Grip strength equal bilaterally, no carotid bruits noted.  Patient points the pain in his neck and it is at the posterior occiput.  Medical Decision Making  Medically screening exam initiated at 10:42 AM.  Appropriate orders placed.  Anvith Mauriello was informed that the remainder of the evaluation will be completed by another provider, this initial triage assessment does not replace that evaluation, and the importance of remaining in the ED until their evaluation is complete.  Basic chart review performed, I looked at the urgent care note.  He had no focal deficits at the time, patient symptoms do not appear consistent with vascular etiology and I do not really  suspect this an acute carotid aneurysm dissection or carotid dissection based on my physical exam.  His symptoms also do not appear consistent with this.  We engaged in shared decision-making, patient would prefer to start with CT head and defer on CTA of head and neck at this time.   Theron Arista, PA-C 10/07/21 1045

## 2021-10-07 NOTE — ED Triage Notes (Signed)
Pt presents for blood pressure concerns and headache x 2 days. Last dose of bp meds were last night around 1:00 am.

## 2021-10-07 NOTE — ED Triage Notes (Signed)
Patient here for evaluation of headache since last Thursday, also reports left sided neck pain. Patient has history of hypertension, states his headache was worse last night so he measured his blood pressure at home to be 184/113 so he took his antihypertensive medications early. Patient seen at [facility] earlier today and send to [facility] for further evaluation. Patient alert, oriented, ambulatory, and in no apparent distress this time.

## 2021-10-07 NOTE — Discharge Instructions (Addendum)
Return to the ER if you start having any vision changes, weakness or numbness on one side of your body, fever or other concerns.  It is okay to take Tylenol or ibuprofen as needed if the headache returns

## 2021-10-07 NOTE — ED Notes (Signed)
Patient discharged to home.  All discharge instructions reviewed.  Patient verbalized understanding via teachback method.  VS WDL.  Respirations even and unlabored.  Ambulatory out of ED.   °

## 2021-10-07 NOTE — ED Notes (Signed)
Patient is being discharged from the Urgent Care and sent to the Emergency Department via POA. Per [facility] PA, patient is in need of higher level of care due to need for further evaluation. Patient is aware and verbalizes understanding of plan of care.  Vitals:   10/07/21 0902 10/07/21 0904  BP: (!) 158/103   Pulse: 93   Resp: 16   Temp: 98.3 F (36.8 C) 98.4 F (36.9 C)  SpO2: 100%

## 2021-10-07 NOTE — ED Triage Notes (Signed)
Headache x 4 days. His BP was elevated yesterday. He had a CT scan in the ER at [facility] today. He left without tx completed.

## 2022-03-13 ENCOUNTER — Ambulatory Visit (INDEPENDENT_AMBULATORY_CARE_PROVIDER_SITE_OTHER): Payer: BC Managed Care – PPO | Admitting: Pulmonary Disease

## 2022-03-13 ENCOUNTER — Ambulatory Visit (INDEPENDENT_AMBULATORY_CARE_PROVIDER_SITE_OTHER): Payer: BC Managed Care – PPO

## 2022-03-13 ENCOUNTER — Encounter: Payer: Self-pay | Admitting: Pulmonary Disease

## 2022-03-13 VITALS — BP 126/86 | HR 86 | Ht 67.0 in | Wt 219.8 lb

## 2022-03-13 DIAGNOSIS — R051 Acute cough: Secondary | ICD-10-CM | POA: Diagnosis not present

## 2022-03-13 IMAGING — DX DG CHEST 2V
2 series · 2 of 2 positions shown · non-contrast
Comparison: [DATE]

CLINICAL DATA: Cough

EXAM:
CHEST - 2 VIEW

[chest pa]
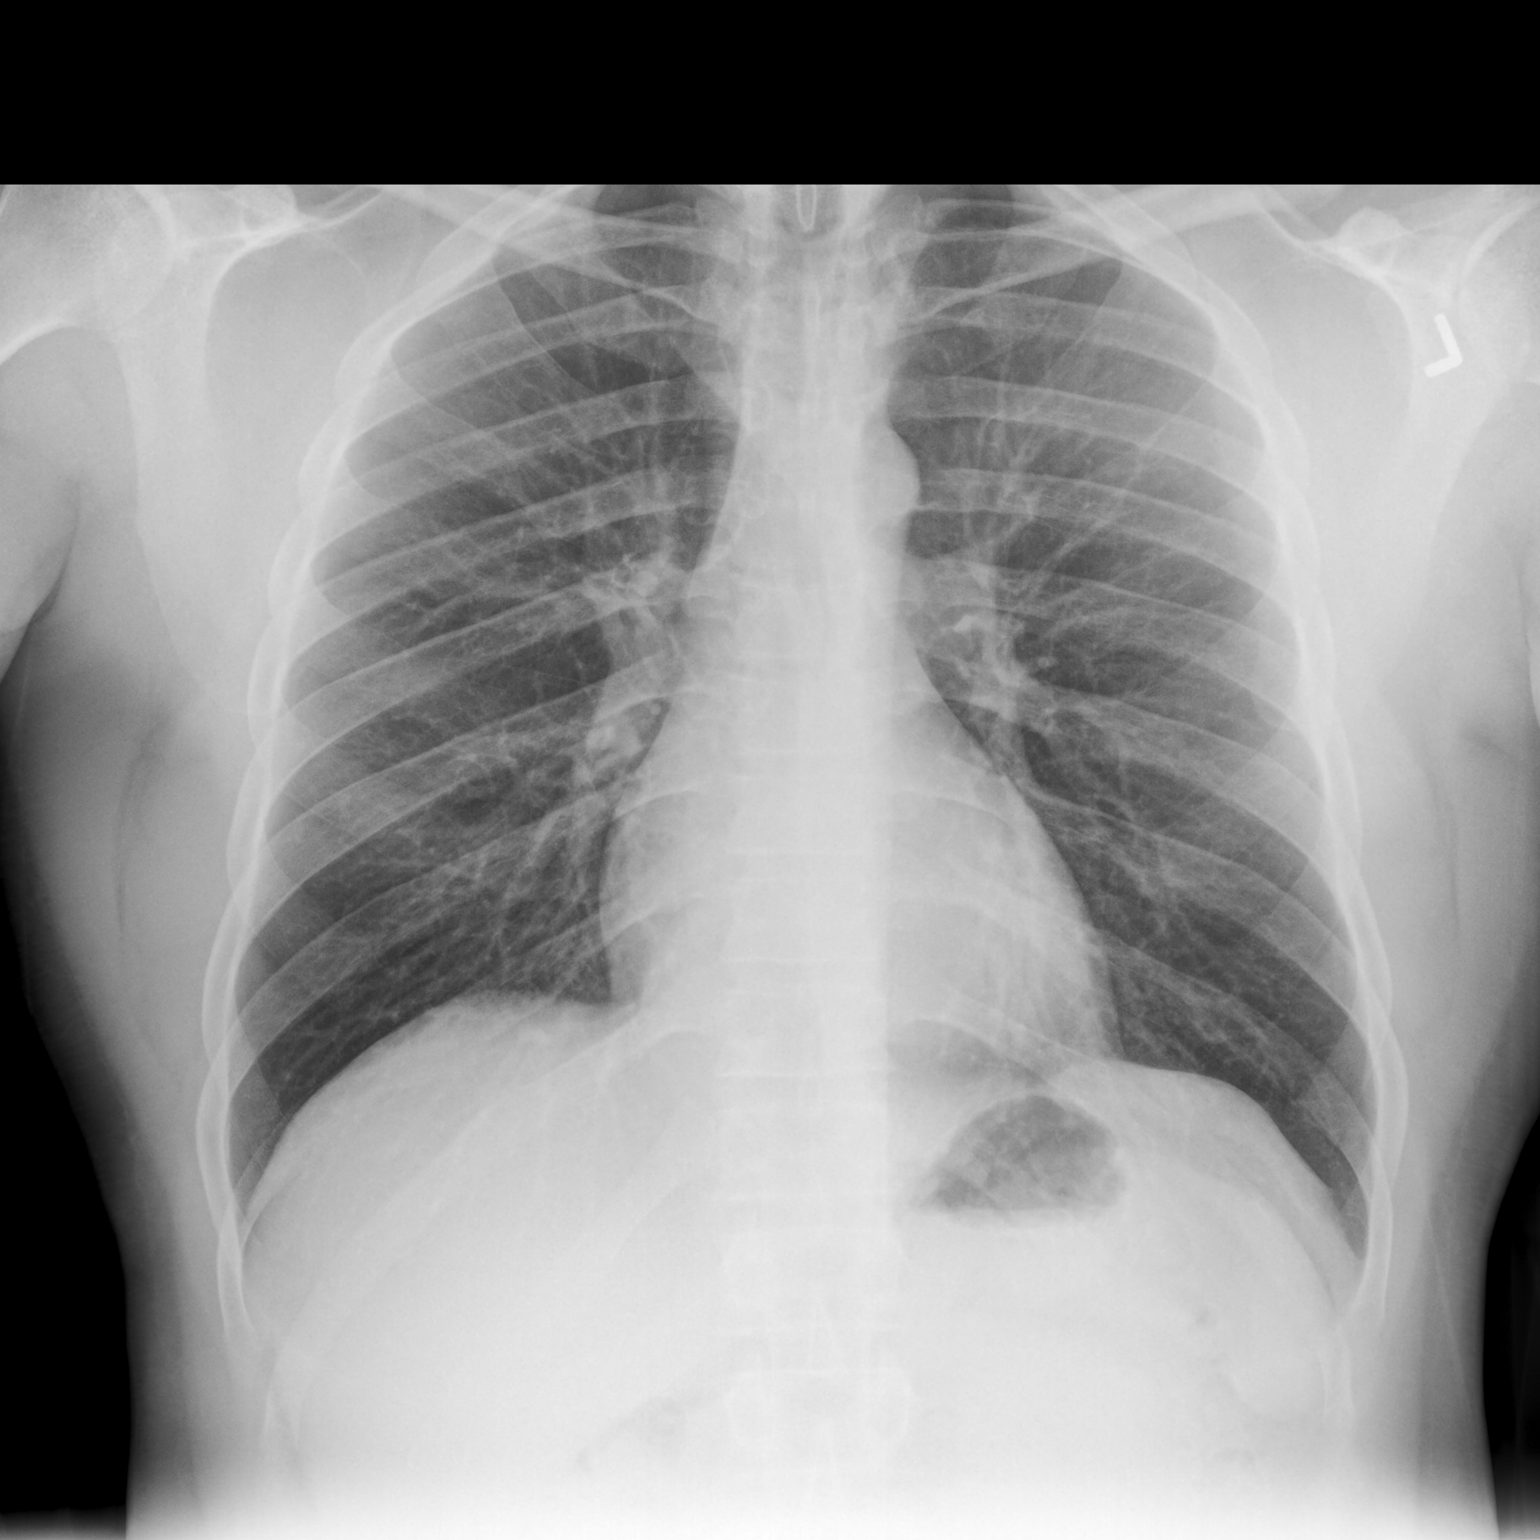

[chest lat]
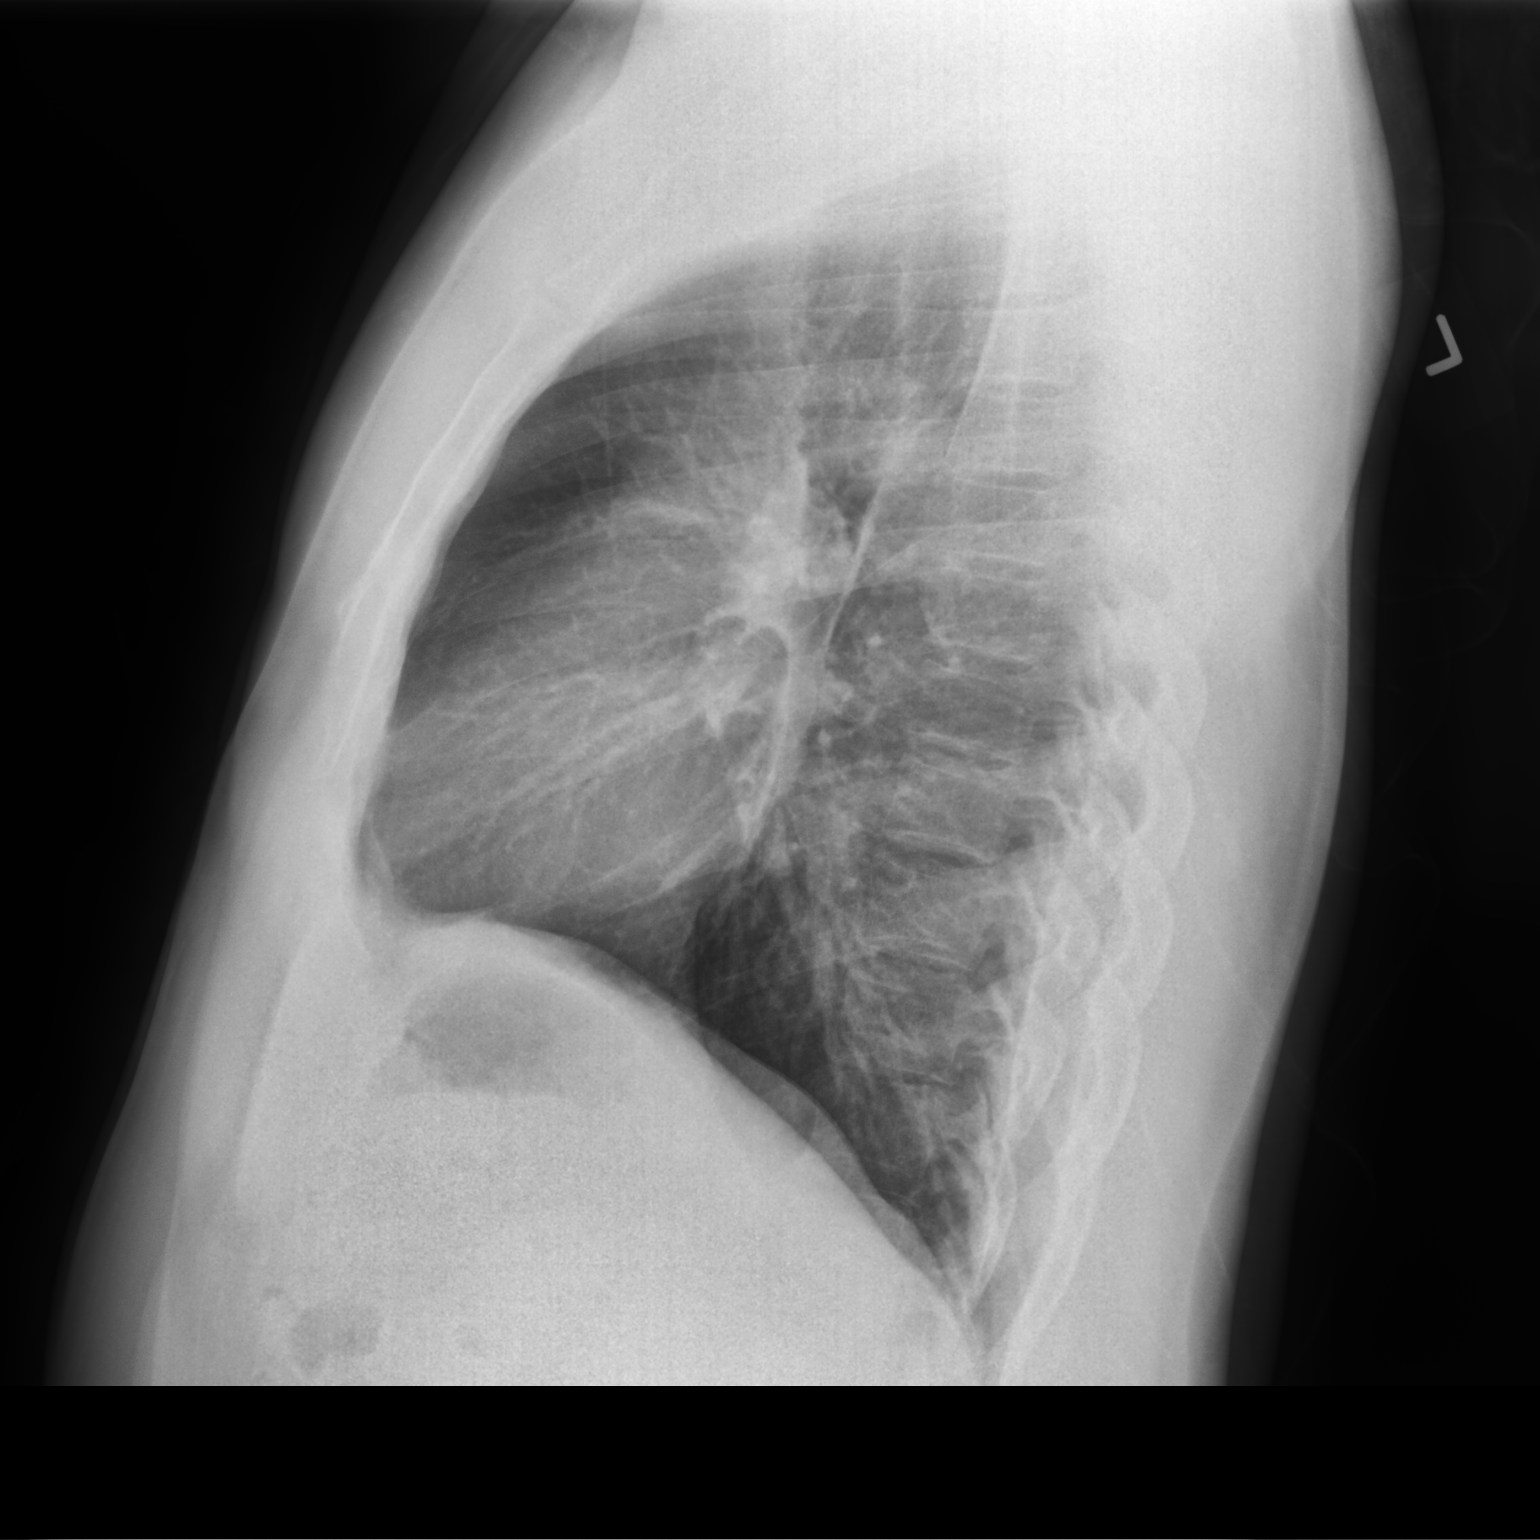

[2 of 2 positions shown; findings below may reference images not displayed]

FINDINGS: The heart size and mediastinal contours are within normal limits.
Both lungs are clear. The visualized skeletal structures are
unremarkable.
IMPRESSION: No active cardiopulmonary disease.

## 2022-03-13 MED ORDER — PREDNISONE 20 MG PO TABS: 20.0000 mg | ORAL_TABLET | Freq: Every day | ORAL | 0 refills | Status: DC

## 2022-03-13 MED ORDER — DOXYCYCLINE HYCLATE 100 MG PO TABS: 100.0000 mg | ORAL_TABLET | Freq: Two times a day (BID) | ORAL | 0 refills | Status: DC

## 2022-03-13 NOTE — Progress Notes (Signed)
Jason Sampson    [id number]    11/08/1980  Primary Care Physician:Polite, Windy Fast, MD  Referring Physician: Renford Dills, MD [address] [place],  [place] [address]  Chief complaint:   Cough, sputum production  HPI:  Cough and sputum production post COVID about 2 months ago  He is not feeling acutely ill at present But has a persistent cough that is not improving Occasional sputum production usually clear Has not had any fevers or chills No nasal stuffiness or congestion  Does have a history of exercise-induced asthma  Uses Symbicort, albuterol use about once a week  Symptoms have been persistent enough to be bothersome, does not appear to be getting significantly better He does get short of breath with significant activity   No family history of lung disease  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    atorvastatin (LIPITOR) 20 MG tablet Take 20 mg by mouth daily.   losartan (COZAAR) 25 MG tablet TAKE 1 TABLET BY MOUTH EVERY DAY   SYMBICORT 80-4.5 MCG/ACT inhaler Inhale 2 puffs into the lungs every 4 (four) hours as needed.   [DISCONTINUED] pantoprazole (PROTONIX) 40 MG tablet TAKE 1 TABLET BY MOUTH EVERY DAY (Patient not taking: Reported on 03/13/2022)   No facility-administered encounter medications on file as of 03/13/2022.    Allergies as of 03/13/2022   (No Known Allergies)    Past Medical History:  Diagnosis Date   Asthma    GERD (gastroesophageal reflux disease)    Hypercholesterolemia    PURE   Hypertension    Palpitations    Seasonal allergies     Past Surgical History:  Procedure Laterality Date   FOOT SURGERY     Lapidus Fusion Left 04/21/2013   @[facility]     Family History  Problem  Relation Age of Onset   Lupus Mother    Sarcoidosis Mother    Hypertension Mother    CVA Mother    Aneurysm Mother    Atrial fibrillation Father    Hypertension Father    Migraines Sister    Hypothyroidism Sister    Colon cancer Neg Hx    Esophageal cancer Neg Hx    Stomach cancer Neg Hx    Rectal cancer Neg Hx     Social History   Socioeconomic History   Marital status: Married    Spouse name: Not on file   Number of children: 4   Years of education: Not on file   Highest education level: Not on file  Occupational History   Not on file  Tobacco Use   Smoking status: Never   Smokeless tobacco: Never  Vaping Use   Vaping Use: Never used  Substance and Sexual Activity   Alcohol use: No   Drug use: No   Sexual activity: Not on file  Other Topics Concern   Not on file  Social History Narrative   Not on file   Social Determinants of Health   Financial Resource Strain: Not on file  Food Insecurity: Not on file  Transportation Needs: Not on file  Physical Activity: Not on file  Stress: Not on file  Social Connections: Not on file  Intimate Partner Violence: Not on file  Review of Systems  Constitutional:  Negative for fatigue.  Respiratory:  Positive for cough and shortness of breath.     Vitals:   03/13/22 1038  BP: 126/86  Pulse: 86  SpO2: 99%     Physical Exam Constitutional:      Appearance: Normal appearance.  HENT:     Right Ear: Tympanic membrane normal.     Mouth/Throat:     Mouth: Mucous membranes are moist.  Cardiovascular:     Rate and Rhythm: Normal rate and regular rhythm.     Heart sounds: No murmur heard.    No friction rub.  Pulmonary:     Effort: No respiratory distress.     Breath sounds: No stridor. No wheezing or rhonchi.  Musculoskeletal:     Cervical back: No rigidity or tenderness.  Neurological:     Mental Status: He is alert.  Psychiatric:        Mood and Affect: Mood normal.    Data Reviewed: Last chest x-ray  on record is 05/14/2020 with an improving left lower lobe infiltrate-reviewed by myself  Assessment:  Bronchitis  Airway hyperresponsiveness which may be secondary to recent COVID infection  History of exercise-induced asthma  Chronic cough  Shortness of breath on exertion  Plan/Recommendations: Prescription for doxycycline and prednisone  May consider increasing Symbicort from 80 to-160  Continue using albuterol as needed  Obtain a chest x-ray today  Tentative follow-up in about 3 months  Encouraged to call with any significant concerns   Virl Diamond MD [facility] 03/13/2022, 10:40 AM  CC: Renford Dills, MD

## 2022-03-13 NOTE — Patient Instructions (Signed)
Obtain a chest x-ray today  Prescription for steroid and antibiotic sent to pharmacy  Call us in a couple of weeks if you are still feeling poorly and we need to consider increasing the dose of Symbicort to the 160 to be used twice a day  Continue albuterol use as needed  We can consider a breathing study further down the line if symptoms are not optimally controlled  I will see you back in about 3 months

## 2022-03-14 NOTE — Progress Notes (Signed)
Chest x-ray reviewed, within normal limits

## 2022-04-28 ENCOUNTER — Other Ambulatory Visit: Payer: Self-pay | Admitting: Physician Assistant

## 2022-05-13 NOTE — Progress Notes (Unsigned)
NEUROLOGY FOLLOW UP OFFICE NOTE  Jason Sampson [id number]  Assessment/Plan:   Intractable episodic headaches - characterization unclear for particular headache syndrome.  Probable migraine with status migrainosus possible.  Also consider episodic cluster - however headache diffuse and no autonomic symptoms (except briefly)   HTN.   1.  MRI of brain with and without contrast 2.  Patient defers starting preventative now.  If another episode occurs in the next 6 months, would start topiramate and prednisone taper. 3.  Otherwise, follow up 6 months. 4.  Follow up with PCP/cardiology regarding blood pressure     Subjective:  Jason Sampson is a 41 year old right-handed male with PAF (not on AC) and HTN who follows up for headache.  UPDATE: Last seen in initial consultation in April 2022.  MRI of brain with and without contrast on 02/02/2021 personally reviewed showed chronic nonspecific T2/FLAIR hyperintensities in the cerebral white matter bilaterally.  MRI of cervical spine with and without contrast showed mild spondylosis at C3-4 throught C6-7 but no cord signal abnormalities.  Deferred starting headache preventative medication at that time but advised to follow up with PCP/cardiology for management of blood pressure.  ***  HISTORY:  In January 2022, he had severe diffuse sharp/stabbing headaches (right worse than left).  Persistent 10/10 but every hour would increase to 8/9 and at night.  Severe pain would last 5-6 minutes.  In the morning he felt fine but then would start later in the morning.  Mild photophobia but no nausea, vomiting, visual disturbance, phonophobia, numbness and weakness.  No specific triggers.  He was taking ASA which was ineffective.  Went to UC and was treated for an ear infection.  No improvement.  Treated for allergies and no improvement.  Blood pressure was elevated, over 200/100.  In February, he eyes bloodshot and started having drainage from eyes and  nose with sneezing and headache resolved.     Over that previous summer, he had similar headaches with high blood pressure.  His mother has history of cerebral aneurysms, so CTA of head was performed, which was personally reviewed, and was normal.     He had similar headache in 2018.  CT head at that time was also unremarkable.    PAST MEDICAL HISTORY: Past Medical History:  Diagnosis Date   Asthma    GERD (gastroesophageal reflux disease)    Hypercholesterolemia    PURE   Hypertension    Palpitations    Seasonal allergies     MEDICATIONS: Current Outpatient Medications on File Prior to Visit  Medication Sig Dispense Refill   albuterol (PROVENTIL) (2.5 MG/3ML) 0.083% nebulizer solution 3 ml as needed     amLODipine (NORVASC) 10 MG tablet Take 10 mg by mouth daily.      atorvastatin (LIPITOR) 20 MG tablet Take 20 mg by mouth daily.     doxycycline (VIBRA-TABS) 100 MG tablet Take 1 tablet (100 mg total) by mouth 2 (two) times daily. 14 tablet 0   losartan (COZAAR) 25 MG tablet TAKE 1 TABLET BY MOUTH EVERY DAY 30 tablet 0   predniSONE (DELTASONE) 20 MG tablet Take 1 tablet (20 mg total) by mouth daily with breakfast. 7 tablet 0   SYMBICORT 80-4.5 MCG/ACT inhaler Inhale 2 puffs into the lungs every 4 (four) hours as needed.     No current facility-administered medications on file prior to visit.    ALLERGIES: No Known Allergies  FAMILY HISTORY: Family History  Problem Relation Age of Onset  Lupus Mother    Sarcoidosis Mother    Hypertension Mother    CVA Mother    Aneurysm Mother    Atrial fibrillation Father    Hypertension Father    Migraines Sister    Hypothyroidism Sister    Colon cancer Neg Hx    Esophageal cancer Neg Hx    Stomach cancer Neg Hx    Rectal cancer Neg Hx       Objective:  *** General: No acute distress.  Patient appears ***-groomed.   Head:  Normocephalic/atraumatic Eyes:  Fundi examined but not visualized Neck: supple, no paraspinal  tenderness, full range of motion Heart:  Regular rate and rhythm Lungs:  Clear to auscultation bilaterally Back: No paraspinal tenderness Neurological Exam: alert and oriented to person, place, and time.  Speech fluent and not dysarthric, language intact.  CN II-XII intact. Bulk and tone normal, muscle strength 5/5 throughout.  Sensation to light touch intact.  Deep tendon reflexes 2+ throughout, toes downgoing.  Finger to nose testing intact.  Gait normal, Romberg negative.   Shon Millet, DO  CC: ***

## 2022-05-14 ENCOUNTER — Ambulatory Visit: Payer: BC Managed Care – PPO | Admitting: Neurology

## 2022-05-14 ENCOUNTER — Encounter: Payer: Self-pay | Admitting: Neurology

## 2022-05-14 VITALS — BP 131/85 | HR 88 | Ht 67.5 in | Wt 221.4 lb

## 2022-05-14 DIAGNOSIS — R9089 Other abnormal findings on diagnostic imaging of central nervous system: Secondary | ICD-10-CM

## 2022-05-14 DIAGNOSIS — R519 Headache, unspecified: Secondary | ICD-10-CM | POA: Diagnosis not present

## 2022-05-14 DIAGNOSIS — H9211 Otorrhea, right ear: Secondary | ICD-10-CM | POA: Diagnosis not present

## 2022-05-14 NOTE — Patient Instructions (Signed)
Repeat MRI of brain with and without contrast Follow up with Dr. Jenne Pane regarding fluid in the right ear Pending results or at your request, we can start a daily medication to treat the headache Limit use of pain relievers to no more than 2 days out of week to prevent risk of rebound or medication-overuse headache. Follow up in 4 months.

## 2022-05-29 ENCOUNTER — Ambulatory Visit
Admission: RE | Admit: 2022-05-29 | Discharge: 2022-05-29 | Disposition: A | Discharge status: Home / Self Care | Payer: BC Managed Care – PPO | Source: Ambulatory Visit | Attending: Neurology | Admitting: Neurology

## 2022-05-29 DIAGNOSIS — R9089 Other abnormal findings on diagnostic imaging of central nervous system: Secondary | ICD-10-CM

## 2022-05-29 DIAGNOSIS — R519 Headache, unspecified: Secondary | ICD-10-CM

## 2022-05-29 MED ORDER — GADOBENATE DIMEGLUMINE 529 MG/ML IV SOLN
20.0000 mL | Freq: Once | INTRAVENOUS | Status: AC | PRN
  Administered 2022-05-29: 20 mL via INTRAVENOUS

## 2022-05-29 NOTE — Progress Notes (Addendum)
Pt was at our facility have an MRI With contrast done. The patient reports he was finished with his scan and walked out to car. Upon getting into his car, he noticed his right forearm was hurting and was swollen, where his IV was discontinued. Pt stated he walked right back into the building and told a staff member about what was happening. The staff member immediately brought the patient back in the nursing recovery area where I assessed the site. The pts right forearm had a visible swollen area under the skin. No external bleeding was noted. The patient could move his arm, hand, fingers. Pt denied any tingling, numbness, pain in his fingers. Pts cap refill was less than 2 seconds on all fingers on the right arm. Ice was immediatly applied, the arm raised above the pts head, and pressure was held. Dr. Mosetta Putt was notified of the patient and assessed him. Dr. Mosetta Putt ordered to keep the patient in the nursing area, continue to elevate the arm and ice the site The patient remained in the nursing department for 25 minutes. The patient had no other complaints during this time. The site was reassessed by myself and Dr. Mosetta Putt multiple times during his stay, who felt the site swelling was improving. Dr. Mosetta Putt educated the patient on when to seek emergency medical attention (if the site started to enlarge, tingling, numbness, loss of sensation in his fingers, hand, or arm, or developed pain). Pt verbalized understanding and was discharged from our facility. Rich, MRI supervisor made aware.

## 2022-06-04 ENCOUNTER — Telehealth: Payer: Self-pay

## 2022-06-04 MED ORDER — TOPIRAMATE 25 MG PO TABS: ORAL_TABLET | ORAL | 0 refills | Status: AC

## 2022-06-04 NOTE — Telephone Encounter (Signed)
Patient advised MRI results.  topiramate 25mg  at bedtime for one week, then increase to 50mg  at bedtime.  Contact in 4-5 weeks for refill and update.  Sent to the pharmacy.

## 2022-06-04 NOTE — Telephone Encounter (Signed)
-----   Message from Drema Dallas, DO sent at 05/30/2022  7:08 AM EDT ----- MRI is stable compared to last year.  If he is now agreeable, we can start a medication to try and decrease frequency of these headaches - I would recommend topiramate.  He should be aware that numbness and tingling may be a side effect, so not to worry IF he experiences this (usually resolves once his body gets used to the medication).  That doesn't mean he will experience this.  If agreeable, please send prescription for topiramate 25mg  at bedtime for one week, then increase to 50mg  at bedtime.  Contact in 4-5 weeks for refill and update.

## 2022-06-17 ENCOUNTER — Ambulatory Visit: Payer: BC Managed Care – PPO | Admitting: Pulmonary Disease

## 2022-09-11 ENCOUNTER — Ambulatory Visit: Payer: BC Managed Care – PPO | Admitting: Neurology

## 2022-09-15 NOTE — Progress Notes (Deleted)
NEUROLOGY FOLLOW UP OFFICE NOTE  Jason Sampson [id number]  Assessment/Plan:   Chronic daily headache Otorrhea, right Abnormal brain MRI - probable chronic small vessel ischemic changes     1.  ***     Subjective:  Jason Sampson is a 41 year old right-handed male with PAF (not on AC) and HTN who follows up for headache.  UPDATE: ***  Repeat MRI of brain with and without contrast on 05/29/2022 personally reviewed revealed unchanged nonspecific moderately advanced cerebral white matter T2 hyperintensities.  Followed up with ENT regarding otorrhea.  ***  HISTORY:  In January 2022, he had severe diffuse sharp/stabbing headaches (right worse than left).  Persistent 10/10 but every hour would increase to 8/9 and at night.  Severe pain would last 5-6 minutes.  In the morning he felt fine but then would start later in the morning.  Mild photophobia but no nausea, vomiting, visual disturbance, phonophobia, numbness and weakness.  No specific triggers.  He was taking ASA which was ineffective.  Went to UC and was treated for an ear infection.  No improvement.  Treated for allergies and no improvement.  Blood pressure was elevated, over 200/100.  In February, he eyes bloodshot and started having drainage from eyes and nose with sneezing and headache resolved.   MRI of brain with and without contrast on 02/02/2021 showed chronic nonspecific T2/FLAIR hyperintensities in the cerebral white matter bilaterally.  MRI of cervical spine with and without contrast showed mild spondylosis at C3-4 throught C6-7 but no cord signal abnormalities.  Deferred starting headache preventative medication at that time but advised to follow up with PCP/cardiology for management of blood pressure.   Over that previous summer, he had similar headaches with high blood pressure.  His mother has history of cerebral aneurysms, so CTA of head was performed and was normal.     He had similar headache in 2018.  CT head  at that time was also unremarkable.    PAST MEDICAL HISTORY: Past Medical History:  Diagnosis Date   Asthma    GERD (gastroesophageal reflux disease)    Hypercholesterolemia    PURE   Hypertension    Palpitations    Seasonal allergies     MEDICATIONS: Current Outpatient Medications on File Prior to Visit  Medication Sig Dispense Refill   albuterol (PROVENTIL) (2.5 MG/3ML) 0.083% nebulizer solution 3 ml as needed     amLODipine (NORVASC) 10 MG tablet Take 10 mg by mouth daily.      atorvastatin (LIPITOR) 20 MG tablet Take 20 mg by mouth daily.     losartan (COZAAR) 25 MG tablet TAKE 1 TABLET BY MOUTH EVERY DAY 30 tablet 0   SYMBICORT 80-4.5 MCG/ACT inhaler Inhale 2 puffs into the lungs every 4 (four) hours as needed.     topiramate (TOPAMAX) 25 MG tablet topiramate 25mg  at bedtime for one week, then increase to 50mg  at bedtime.  Contact in 4-5 weeks for refill and update. 60 tablet 0   No current facility-administered medications on file prior to visit.    ALLERGIES: No Known Allergies  FAMILY HISTORY: Family History  Problem Relation Age of Onset   Lupus Mother    Sarcoidosis Mother    Hypertension Mother    CVA Mother    Aneurysm Mother    Atrial fibrillation Father    Hypertension Father    Migraines Sister    Hypothyroidism Sister    Colon cancer Neg Hx    Esophageal cancer Neg Hx  Stomach cancer Neg Hx    Rectal cancer Neg Hx       Objective:  *** General: No acute distress.  Patient appears well-groomed.   Head:  Normocephalic/atraumatic Eyes:  Fundi examined but not visualized Ears:  TM clear, no drainage Neck: supple, no paraspinal tenderness, full range of motion Heart:  Regular rate and rhythm Neurological Exam: ***   Shon Millet, DO  CC: Renford Dills, MD

## 2022-09-17 ENCOUNTER — Ambulatory Visit: Payer: BC Managed Care – PPO | Admitting: Neurology

## 2022-09-17 ENCOUNTER — Encounter: Payer: Self-pay | Admitting: Neurology

## 2022-09-17 DIAGNOSIS — Z029 Encounter for administrative examinations, unspecified: Secondary | ICD-10-CM

## 2023-10-23 ENCOUNTER — Other Ambulatory Visit: Payer: Self-pay

## 2023-10-23 ENCOUNTER — Encounter (HOSPITAL_BASED_OUTPATIENT_CLINIC_OR_DEPARTMENT_OTHER): Payer: Self-pay | Admitting: Emergency Medicine

## 2023-10-23 ENCOUNTER — Emergency Department (HOSPITAL_BASED_OUTPATIENT_CLINIC_OR_DEPARTMENT_OTHER)
Admission: EM | Admit: 2023-10-23 | Discharge: 2023-10-23 | Disposition: A | Discharge status: Home / Self Care | Payer: BC Managed Care – PPO | Attending: Emergency Medicine | Admitting: Emergency Medicine

## 2023-10-23 DIAGNOSIS — Z79899 Other long term (current) drug therapy: Secondary | ICD-10-CM | POA: Insufficient documentation

## 2023-10-23 DIAGNOSIS — R519 Headache, unspecified: Secondary | ICD-10-CM | POA: Insufficient documentation

## 2023-10-23 DIAGNOSIS — I10 Essential (primary) hypertension: Secondary | ICD-10-CM | POA: Insufficient documentation

## 2023-10-23 DIAGNOSIS — Z7982 Long term (current) use of aspirin: Secondary | ICD-10-CM | POA: Insufficient documentation

## 2023-10-23 DIAGNOSIS — I160 Hypertensive urgency: Secondary | ICD-10-CM

## 2023-10-23 LAB — CBC WITH DIFFERENTIAL/PLATELET
Abs Immature Granulocytes: 0.02 10*3/uL (ref 0.00–0.07)
Basophils Absolute: 0 10*3/uL (ref 0.0–0.1)
Basophils Relative: 0 %
Eosinophils Absolute: 0.1 10*3/uL (ref 0.0–0.5)
Eosinophils Relative: 2 %
HCT: 41.4 % (ref 39.0–52.0)
Hemoglobin: 14 g/dL (ref 13.0–17.0)
Immature Granulocytes: 0 %
Lymphocytes Relative: 25 %
Lymphs Abs: 1.9 10*3/uL (ref 0.7–4.0)
MCH: 28.9 pg (ref 26.0–34.0)
MCHC: 33.8 g/dL (ref 30.0–36.0)
MCV: 85.5 fL (ref 80.0–100.0)
Monocytes Absolute: 0.8 10*3/uL (ref 0.1–1.0)
Monocytes Relative: 11 %
Neutro Abs: 4.6 10*3/uL (ref 1.7–7.7)
Neutrophils Relative %: 62 %
Platelets: 224 10*3/uL (ref 150–400)
RBC: 4.84 MIL/uL (ref 4.22–5.81)
RDW: 12.5 % (ref 11.5–15.5)
WBC: 7.4 10*3/uL (ref 4.0–10.5)
nRBC: 0 % (ref 0.0–0.2)

## 2023-10-23 LAB — BASIC METABOLIC PANEL
Anion gap: 9 (ref 5–15)
BUN: 11 mg/dL (ref 6–20)
CO2: 25 mmol/L (ref 22–32)
Calcium: 8.8 mg/dL — ABNORMAL LOW (ref 8.9–10.3)
Chloride: 103 mmol/L (ref 98–111)
Creatinine, Ser: 1.06 mg/dL (ref 0.61–1.24)
GFR, Estimated: >60 mL/min (ref >60)
Glucose, Bld: 97 mg/dL (ref 70–99)
Potassium: 3.3 mmol/L — ABNORMAL LOW (ref 3.5–5.1)
Sodium: 137 mmol/L (ref 135–145)

## 2023-10-23 LAB — TROPONIN I (HIGH SENSITIVITY)
Troponin I (High Sensitivity): 24 ng/L — ABNORMAL HIGH (ref <18)
Troponin I (High Sensitivity): 23 ng/L — ABNORMAL HIGH (ref <18)

## 2023-10-23 MED ORDER — AMLODIPINE BESYLATE 5 MG PO TABS
10.0000 mg | ORAL_TABLET | Freq: Once | ORAL | Status: AC
  Administered 2023-10-23: 10 mg via ORAL
  Filled 2023-10-23: qty 2

## 2023-10-23 MED ORDER — LOSARTAN POTASSIUM 25 MG PO TABS
25.0000 mg | ORAL_TABLET | Freq: Once | ORAL | Status: AC
  Administered 2023-10-23: 25 mg via ORAL
  Filled 2023-10-23: qty 1

## 2023-10-23 MED ORDER — METOCLOPRAMIDE HCL 5 MG/ML IJ SOLN
10.0000 mg | Freq: Once | INTRAMUSCULAR | Status: AC
  Administered 2023-10-23: 10 mg via INTRAVENOUS
  Filled 2023-10-23: qty 2

## 2023-10-23 MED ORDER — POTASSIUM CHLORIDE CRYS ER 20 MEQ PO TBCR
40.0000 meq | EXTENDED_RELEASE_TABLET | Freq: Once | ORAL | Status: AC
  Administered 2023-10-23: 40 meq via ORAL
  Filled 2023-10-23: qty 2

## 2023-10-23 MED ORDER — LOSARTAN POTASSIUM 25 MG PO TABS: 25.0000 mg | ORAL_TABLET | Freq: Every day | ORAL | 0 refills | Status: AC

## 2023-10-23 NOTE — ED Triage Notes (Signed)
States seen at [facility]  for URI and sore throat, and just started ABX. Main concern is BP is high with diastolic in the 140's causing headaches and dizziness.

## 2023-10-23 NOTE — Discharge Instructions (Addendum)
Your blood pressure was quite elevated today.  You were given your amlodipine and losartan today, do not take these again until tomorrow.  Fortunately it is improving but you will need to make sure you take both of your blood pressure medicines as prescribed and follow-up closely with your primary care physician.  If you develop new or worsening headache, chest pain, shortness of breath, or any other new/concerning symptoms then return to the ER or call 911.

## 2023-10-23 NOTE — ED Provider Notes (Signed)
[facility] Provider Note   CSN: [id number] Arrival date & time: 10/23/23  0447     History  Chief Complaint  Patient presents with   Hypertension    Jason Sampson is a 43 y.o. male.  The history is provided by the patient.  Hypertension  Jason Sampson is a 43 y.o. male who presents to the Emergency Department complaining of headache.  He presents to the emergency department for evaluation of headache that started on Wednesday.  Headache is gradual in onset and overall worsened today.  Pain is located in the right side of his head and his the back of his head.  Pain is migratory.  He has had far worse headaches in the past.  Yesterday he went to urgent care and was told he had a sinus infection and was prescribed antibiotics but has not taken them yet.  When he went to urgent care they noted that his blood pressure was high.  He reports having rawness in his nose and postnasal drip that started earlier in the week but overall this is improving and almost resolved.  He has a history of hypertension, hyperlipidemia.  He takes amlodipine daily.  He is supposed to also be taking losartan but he has been out of losartan for months.  When he does check his blood pressure even on his medications it runs 140s to 150s systolic.  No vision changes, fevers, numbness, weakness, nausea, vomiting.  He has had a mild cough for the last 2 days.  He feels cold sometimes.      Home Medications Prior to Admission medications   Medication Sig Start Date End Date Taking? Authorizing Provider  amLODipine (NORVASC) 10 MG tablet Take 10 mg by mouth daily.    Yes [provider]  aspirin 81 MG chewable tablet Chew 324 mg by mouth once.   Yes [provider]  atorvastatin (LIPITOR) 20 MG tablet Take 20 mg by mouth daily. 03/24/20  Yes [provider]  fluticasone (FLONASE) 50 MCG/ACT nasal spray Place 2 sprays into both  nostrils daily.   Yes [provider]  albuterol (PROVENTIL) (2.5 MG/3ML) 0.083% nebulizer solution 3 ml as needed    [provider]  losartan (COZAAR) 25 MG tablet TAKE 1 TABLET BY MOUTH EVERY DAY 12/24/20   O'Neal, Ronnald Ramp, MD  SYMBICORT 80-4.5 MCG/ACT inhaler Inhale 2 puffs into the lungs every 4 (four) hours as needed. 01/31/22   [provider]  topiramate (TOPAMAX) 25 MG tablet topiramate 25mg  at bedtime for one week, then increase to 50mg  at bedtime.  Contact us in 4-5 weeks for refill and update. 06/04/22   Drema Dallas, DO      Allergies    Patient has no known allergies.    Review of Systems   Review of Systems  All other systems reviewed and are negative.   Physical Exam Updated Vital Signs BP (!) 193/133   Pulse 81   Temp 98.4 F (36.9 C) (Oral)   Resp 11   Ht 5' 7.5" (1.715 m)   Wt 104.8 kg   SpO2 99%   BMI 35.65 kg/m  Physical Exam Vitals and nursing note reviewed.  Constitutional:      Appearance: He is well-developed.  HENT:     Head: Normocephalic and atraumatic.  Eyes:     Pupils: Pupils are equal, round, and reactive to light.  Cardiovascular:     Rate and Rhythm: Normal rate  and regular rhythm.     Heart sounds: No murmur heard. Pulmonary:     Effort: Pulmonary effort is normal. No respiratory distress.     Breath sounds: Normal breath sounds.  Abdominal:     Palpations: Abdomen is soft.     Tenderness: There is no abdominal tenderness. There is no guarding or rebound.  Musculoskeletal:        General: No tenderness.  Skin:    General: Skin is warm and dry.  Neurological:     Mental Status: He is alert and oriented to person, place, and time.     Comments: No asymmetry of facial movements, visual fields grossly intact.  5/5 strength in all four extremities.   Psychiatric:        Behavior: Behavior normal.     ED Results / Procedures / Treatments   Labs (all labs ordered are listed, but only abnormal results  are displayed) Labs Reviewed  BASIC METABOLIC PANEL - Abnormal; Notable for the following components:      Result Value   Potassium 3.3 (*)    Calcium 8.8 (*)    All other components within normal limits  CBC WITH DIFFERENTIAL/PLATELET  TROPONIN I (HIGH SENSITIVITY)    EKG EKG Interpretation Date/Time:  Friday October 23 2023 05:02:46 EST Ventricular Rate:  90 PR Interval:  155 QRS Duration:  112 QT Interval:  350 QTC Calculation: 429 R Axis:   39  Text Interpretation: Sinus rhythm Borderline intraventricular conduction delay Nonspecific T abnormalities, inferior leads Confirmed by Tilden Fossa [phone] on 10/23/2023 5:21:29 AM  Radiology No results found.  Procedures Procedures    Medications Ordered in ED Medications  metoCLOPramide (REGLAN) injection 10 mg (10 mg Intravenous Given 10/23/23 0641)  amLODipine (NORVASC) tablet 10 mg (10 mg Oral Given 10/23/23 1914)    ED Course/ Medical Decision Making/ A&P                                 Medical Decision Making Amount and/or Complexity of Data Reviewed Labs: ordered.  Risk Prescription drug management.   Patient with history of hypertension here for evaluation of headache.  He is nontoxic-appearing on evaluation with no focal neurologic deficits.  Blood pressure is elevated, has a history of hypertension.  EKG with LVH changes, this is similar when reviewed compared to priors.  He did have a brief episode of chest pain yesterday, which she attributes to gas.  Will check a single troponin.  He has not had screening lab work for over a year.  Will check renal function given his high blood pressure.  Will provide his morning dose of amlodipine to treat his headache.  Current clinical picture is not consistent with subarachnoid hemorrhage, dural sinus thrombus, meningitis.  Patient care transferred pending meds, labs and reevaluation.        Final Clinical Impression(s) / ED Diagnoses Final diagnoses:  None     Rx / DC Orders ED Discharge Orders     None         Tilden Fossa, MD 10/23/23 [phone]

## 2023-10-23 NOTE — ED Provider Notes (Signed)
7:15 AM Care transferred to me.  Patient's initial blood work shows troponin of 24, will need a repeat, could just be secondary to poorly controlled hypertension as he is not having chest pain (did have chest pain yesterday).  Headache is improving and blood pressure is improving.  He tells me that he would like to take the losartan, but there has been an issue with his insurance and reauthorization, etc.  Will give him a dose now as well as a prescription assuming second troponin is okay and he feels better.  8:40 AM Patient is feeling well. BP downtrending (172/130 most recently).  No chest pain.  Troponin is flat/slightly elevated, likely from blood pressure more than ACS.  He is well-appearing and feels well enough for discharge.  He states that the Cozaar issue is more of an insurance issue but he feels like he can pick up a 1 month supply and talk to his doctor about further blood pressure control.  Will send a 30-day refill and have him follow-up with his PCP.   [name], MD 10/23/23 [phone]

## 2024-10-19 ENCOUNTER — Ambulatory Visit: Admission: EM | Admit: 2024-10-19 | Discharge: 2024-10-19 | Disposition: A | Discharge status: Home / Self Care

## 2024-10-19 ENCOUNTER — Encounter: Payer: Self-pay | Admitting: Emergency Medicine

## 2024-10-19 ENCOUNTER — Encounter (HOSPITAL_COMMUNITY): Payer: Self-pay

## 2024-10-19 ENCOUNTER — Emergency Department (HOSPITAL_COMMUNITY)
Admission: EM | Admit: 2024-10-19 | Discharge: 2024-10-20 | Disposition: A | Discharge status: Home / Self Care | Attending: Emergency Medicine | Admitting: Emergency Medicine

## 2024-10-19 ENCOUNTER — Other Ambulatory Visit: Payer: Self-pay

## 2024-10-19 ENCOUNTER — Emergency Department (HOSPITAL_COMMUNITY)

## 2024-10-19 DIAGNOSIS — R600 Localized edema: Secondary | ICD-10-CM | POA: Diagnosis not present

## 2024-10-19 DIAGNOSIS — I161 Hypertensive emergency: Secondary | ICD-10-CM | POA: Diagnosis not present

## 2024-10-19 DIAGNOSIS — I10 Essential (primary) hypertension: Secondary | ICD-10-CM | POA: Diagnosis not present

## 2024-10-19 DIAGNOSIS — I159 Secondary hypertension, unspecified: Secondary | ICD-10-CM | POA: Insufficient documentation

## 2024-10-19 DIAGNOSIS — Z79899 Other long term (current) drug therapy: Secondary | ICD-10-CM | POA: Diagnosis not present

## 2024-10-19 DIAGNOSIS — R519 Headache, unspecified: Secondary | ICD-10-CM

## 2024-10-19 DIAGNOSIS — Z7982 Long term (current) use of aspirin: Secondary | ICD-10-CM | POA: Diagnosis not present

## 2024-10-19 DIAGNOSIS — R791 Abnormal coagulation profile: Secondary | ICD-10-CM | POA: Insufficient documentation

## 2024-10-19 DIAGNOSIS — J45909 Unspecified asthma, uncomplicated: Secondary | ICD-10-CM | POA: Diagnosis not present

## 2024-10-19 LAB — COMPREHENSIVE METABOLIC PANEL WITH GFR
ALT: 23 U/L (ref 0–44)
AST: 24 U/L (ref 15–41)
Albumin: 4.3 g/dL (ref 3.5–5.0)
Alkaline Phosphatase: 130 U/L — ABNORMAL HIGH (ref 38–126)
Anion gap: 13 (ref 5–15)
BUN: 16 mg/dL (ref 6–20)
CO2: 24 mmol/L (ref 22–32)
Calcium: 9.4 mg/dL (ref 8.9–10.3)
Chloride: 102 mmol/L (ref 98–111)
Creatinine, Ser: 1.09 mg/dL (ref 0.61–1.24)
GFR, Estimated: >60 mL/min
Glucose, Bld: 84 mg/dL (ref 70–99)
Potassium: 3.6 mmol/L (ref 3.5–5.1)
Sodium: 139 mmol/L (ref 135–145)
Total Bilirubin: 0.4 mg/dL (ref 0.0–1.2)
Total Protein: 8.2 g/dL — ABNORMAL HIGH (ref 6.5–8.1)

## 2024-10-19 LAB — APTT: aPTT: 35 s (ref 24–36)

## 2024-10-19 LAB — DIFFERENTIAL
Abs Immature Granulocytes: 0.02 K/uL (ref 0.00–0.07)
Basophils Absolute: 0 K/uL (ref 0.0–0.1)
Basophils Relative: 0 %
Eosinophils Absolute: 0.1 K/uL (ref 0.0–0.5)
Eosinophils Relative: 1 %
Immature Granulocytes: 0 %
Lymphocytes Relative: 31 %
Lymphs Abs: 2.2 K/uL (ref 0.7–4.0)
Monocytes Absolute: 0.8 K/uL (ref 0.1–1.0)
Monocytes Relative: 12 %
Neutro Abs: 4 K/uL (ref 1.7–7.7)
Neutrophils Relative %: 56 %

## 2024-10-19 LAB — ETHANOL: Alcohol, Ethyl (B): <15 mg/dL

## 2024-10-19 LAB — CBC
HCT: 44.1 % (ref 39.0–52.0)
Hemoglobin: 15.1 g/dL (ref 13.0–17.0)
MCH: 29.4 pg (ref 26.0–34.0)
MCHC: 34.2 g/dL (ref 30.0–36.0)
MCV: 86 fL (ref 80.0–100.0)
Platelets: 241 K/uL (ref 150–400)
RBC: 5.13 MIL/uL (ref 4.22–5.81)
RDW: 12.6 % (ref 11.5–15.5)
WBC: 7.2 K/uL (ref 4.0–10.5)
nRBC: 0 % (ref 0.0–0.2)

## 2024-10-19 LAB — PROTIME-INR
INR: 1.1 (ref 0.8–1.2)
Prothrombin Time: 14.9 s (ref 11.4–15.2)

## 2024-10-19 LAB — I-STAT CHEM 8, ED
BUN: 17 mg/dL (ref 6–20)
Calcium, Ion: 1.11 mmol/L — ABNORMAL LOW (ref 1.15–1.40)
Chloride: 104 mmol/L (ref 98–111)
Creatinine, Ser: 1.1 mg/dL (ref 0.61–1.24)
Glucose, Bld: 88 mg/dL (ref 70–99)
HCT: 45 % (ref 39.0–52.0)
Hemoglobin: 15.3 g/dL (ref 13.0–17.0)
Potassium: 3.3 mmol/L — ABNORMAL LOW (ref 3.5–5.1)
Sodium: 141 mmol/L (ref 135–145)
TCO2: 23 mmol/L (ref 22–32)

## 2024-10-19 MED ORDER — SODIUM CHLORIDE 0.9% FLUSH: 3.0000 mL | Freq: Once | INTRAVENOUS | Status: DC

## 2024-10-19 NOTE — ED Triage Notes (Signed)
 Patient sent from urgent care this evening reports persistent headache and hypertension for 2 weeks.Also concerned about right ear drainage and blurred vision .

## 2024-10-19 NOTE — Discharge Instructions (Addendum)
-  Please head straight to the ER. I am concerned about your significantly elevated blood pressure + headache and blurry vision.  -Please head straight there. If your symptoms change or worsen on the way, stop and call 911.

## 2024-10-19 NOTE — ED Triage Notes (Signed)
 Patient sent from [facility] for HTN and blurry vision. Patient reports the HTN and headache have been on going for 2 weeks, blurry vision is new today. Patient has been taking BP meds as prescribed.

## 2024-10-19 NOTE — ED Triage Notes (Addendum)
 Pt c/o pressure headache and pressure in face for two weeks. States it feels different from migraines that he has had in the past.  A few days ago he had drainage out of right ear.  States he has noticed BP has been higher than normal for about a week. He has not been great about taking BP medication every day.

## 2024-10-19 NOTE — ED Provider Notes (Signed)
 [facility]    CSN: [id number] Arrival date & time: 10/19/24  1836      History   Chief Complaint Chief Complaint  Patient presents with   Headache    HPI Jason Sampson is a 44 y.o. male presenting with headache.  Medical history of headaches, hypertension, hypertensive urgency, A-fib.  Pt c/o pressure headache and pressure in face and head for two weeks. States it feels different from migraines that he has had in the past. Endorses slightly blurred vision, which is new for him.  Endorses photophobia. Denies dizziness. Denies congestion. A few days ago he had drainage out of right ear. Denies recent URI.   States he has noticed BP has been higher than normal for about a week. He has not been great about taking BP medication every day.     HPI  Past Medical History:  Diagnosis Date   Asthma    GERD (gastroesophageal reflux disease)    Hypercholesterolemia    PURE   Hypertension    Palpitations    Seasonal allergies     Patient Active Problem List   Diagnosis Date Noted   Chest pain with low risk for cardiac etiology 06/22/2020   PAF (paroxysmal atrial fibrillation) (HCC) 06/22/2020   Palpitations 12/07/2018   PVC's (premature ventricular contractions) 12/07/2018   Neck pain 02/05/2018   Acute seasonal allergic rhinitis 09/01/2016   Essential hypertension 11/14/2014   Complication of internal fixation device 09/09/2013   Pain in left foot 09/09/2013   Status post foot surgery 04/26/2013   Plantar fasciitis of left foot 03/30/2013   Metatarsal deformity 03/30/2013    Past Surgical History:  Procedure Laterality Date   FOOT SURGERY     Lapidus Fusion Left 04/21/2013   @[facility]        Home Medications    Prior to Admission medications  Medication Sig Start Date End Date Taking? Authorizing Provider  losartan -hydrochlorothiazide  (HYZAAR) 50-12.5 MG tablet Take 1 tablet by mouth daily. 08/30/24  Yes [provider]  triamcinolone  cream (KENALOG) 0.1 % Apply 1 Application topically 2 (two) times daily. 10/17/24  Yes [provider]  albuterol  (PROVENTIL ) (2.5 MG/3ML) 0.083% nebulizer solution 3 ml as needed    [provider]  amLODipine  (NORVASC ) 10 MG tablet Take 10 mg by mouth daily.     [provider]  aspirin 81 MG chewable tablet Chew 324 mg by mouth once.    [provider]  atorvastatin (LIPITOR) 20 MG tablet Take 20 mg by mouth daily. 03/24/20   [provider]  fluticasone (FLONASE) 50 MCG/ACT nasal spray Place 2 sprays into both nostrils daily.    [provider]  losartan  (COZAAR ) 25 MG tablet Take 1 tablet (25 mg total) by mouth daily. 10/23/23   Freddi Hamilton, MD  omeprazole  (PRILOSEC) 20 MG capsule Take 20 mg by mouth daily.    [provider]  pantoprazole  (PROTONIX ) 40 MG tablet Take 40 mg by mouth every morning.    [provider]  SYMBICORT 80-4.5 MCG/ACT inhaler Inhale 2 puffs into the lungs every 4 (four) hours as needed. 01/31/22   [provider]  topiramate  (TOPAMAX ) 25 MG tablet topiramate  25mg  at bedtime for one week, then increase to 50mg  at bedtime.  Contact us  in 4-5 weeks for refill and update. 06/04/22   Skeet Juliene SAUNDERS, DO    Family History Family History  Problem Relation Age of Onset   Lupus Mother    Sarcoidosis Mother  Hypertension Mother    CVA Mother    Aneurysm Mother    Atrial fibrillation Father    Hypertension Father    Migraines Sister    Hypothyroidism Sister    Colon cancer Neg Hx    Esophageal cancer Neg Hx    Stomach cancer Neg Hx    Rectal cancer Neg Hx     Social History Social History[1]   Allergies   Patient has no known allergies.   Review of Systems Review of Systems  Eyes:  Positive for photophobia and visual disturbance.  Neurological:  Positive for headaches.     Physical Exam Triage Vital Signs ED Triage Vitals  Encounter Vitals Group     BP      Girls Systolic  BP Percentile      Girls Diastolic BP Percentile      Boys Systolic BP Percentile      Boys Diastolic BP Percentile      Pulse      Resp      Temp      Temp src      SpO2      Weight      Height      Head Circumference      Peak Flow      Pain Score      Pain Loc      Pain Education      Exclude from Growth Chart    No data found.  Updated Vital Signs BP (!) 176/106 (BP Location: Left Arm)   Pulse 96   Temp 97.9 F (36.6 C) (Oral)   Resp 17   SpO2 96%   Visual Acuity Right Eye Distance:   Left Eye Distance:   Bilateral Distance:    Right Eye Near:   Left Eye Near:    Bilateral Near:     Physical Exam Vitals reviewed.  Constitutional:      General: He is not in acute distress.    Appearance: Normal appearance. He is not ill-appearing.  HENT:     Head: Normocephalic and atraumatic.  Pulmonary:     Effort: Pulmonary effort is normal.  Neurological:     General: No focal deficit present.     Mental Status: He is alert and oriented to person, place, and time.     Comments: PERRLA, EOMI.  Cranial nerves II through XII grossly intact.  Fingers to thumb intact, negative Romberg.  Gait intact.  Psychiatric:        Mood and Affect: Mood normal.        Behavior: Behavior normal.        Thought Content: Thought content normal.        Judgment: Judgment normal.      UC Treatments / Results  Labs (all labs ordered are listed, but only abnormal results are displayed) Labs Reviewed - No data to display  EKG   Radiology No results found.  Procedures Procedures (including critical care time)  Medications Ordered in UC Medications - No data to display  Initial Impression / Assessment and Plan / UC Course  I have reviewed the triage vital signs and the nursing notes.  Pertinent labs & imaging results that were available during my care of the patient were reviewed by me and considered in my medical decision making (see chart for details).     Patient is a  pleasant 44 y.o. male presenting with severe headache, blurred vision and significantly elevated blood pressure.  Following discussion,  discharged to the emergency department by POV.     Creatinine clearance estimated to be 135 mL/min based on 09/2023 BMP, using the Cockcroft-Gault equation.  Final Clinical Impressions(s) / UC Diagnoses   Final diagnoses:  Hypertensive emergency     Discharge Instructions      -Please head straight to the ER. I am concerned about your significantly elevated blood pressure + headache and blurry vision.  -Please head straight there. If your symptoms change or worsen on the way, stop and call 911.     ED Prescriptions   None    PDMP not reviewed this encounter.     [1]  Social History Tobacco Use   Smoking status: Never   Smokeless tobacco: Never  Vaping Use   Vaping status: Never Used  Substance Use Topics   Alcohol use: No   Drug use: No     Arlyss Leita BRAVO, PA-C 10/19/24 1911  "

## 2024-10-20 MED ORDER — MAGNESIUM SULFATE 2 GM/50ML IV SOLN
2.0000 g | Freq: Once | INTRAVENOUS | Status: AC
  Administered 2024-10-20: 2 g via INTRAVENOUS
  Filled 2024-10-20: qty 50

## 2024-10-20 MED ORDER — DEXAMETHASONE SOD PHOSPHATE PF 10 MG/ML IJ SOLN
10.0000 mg | Freq: Once | INTRAMUSCULAR | Status: AC
  Administered 2024-10-20: 10 mg via INTRAVENOUS
  Filled 2024-10-20: qty 1

## 2024-10-20 MED ORDER — KETOROLAC TROMETHAMINE 15 MG/ML IJ SOLN
15.0000 mg | Freq: Once | INTRAMUSCULAR | Status: AC
  Administered 2024-10-20: 15 mg via INTRAVENOUS
  Filled 2024-10-20: qty 1

## 2024-10-20 NOTE — ED Provider Notes (Signed)
 " [place] EMERGENC[facility] DEPARTMENT AT [facility] Provider Note   CSN: [id number] Arrival date & time: 10/19/24  1944     Patient presents with: Hypertension   Jason Sampson is a 44 y.o. male.   44 year old male with a past medical history of primary hypertension, migraines, HLD, palpitations, presenting to the ED today for hypertensive emergency with associated visual changes (intermittent blurry, wears glasses, UTD on exam with ophthalmology) vision and headache. He was previously seen by urgent care earlier today for the above symptoms and was sent here due to concerns of hypertensive emergency. BP of 187/112 on arrival, most recent 156/102. He has been medication adherent with his BP meds including daily amlodipine  and losartan -hydrochlorothiazide , but has noticed persistently elevated BP reads over the last 3 weeks. He notes that his headaches are unlike his migraines, and describes them as increasing dull pressure behind the eyes bilaterally as well as general head pressure. He tried aspirin and Excedrin with no relief of symptoms, and now describes the pain as an 8/10. He endorses new onset visual changes with blurriness bilaterally (currently this has resolved), but denies any eye pain or trouble with EOMs. He denies any chest pain, shortness of breath, nausea or vomiting. Reports not sleeping well at night, often waking in the middle of the night unable to fall back asleep.  While awake, he does have pain in his head.  Denies increase in stress or dietary changes.       Prior to Admission medications  Medication Sig Start Date End Date Taking? Authorizing Provider  albuterol  (PROVENTIL ) (2.5 MG/3ML) 0.083% nebulizer solution 3 ml as needed    [provider]  amLODipine  (NORVASC ) 10 MG tablet Take 10 mg by mouth daily.     [provider]  aspirin 81 MG chewable tablet Chew 324 mg by mouth once.    [provider]  atorvastatin (LIPITOR) 20 MG  tablet Take 20 mg by mouth daily. 03/24/20   [provider]  fluticasone (FLONASE) 50 MCG/ACT nasal spray Place 2 sprays into both nostrils daily.    [provider]  losartan  (COZAAR ) 25 MG tablet Take 1 tablet (25 mg total) by mouth daily. 10/23/23   Freddi Hamilton, MD  losartan -hydrochlorothiazide  (HYZAAR) 50-12.5 MG tablet Take 1 tablet by mouth daily. 08/30/24   [provider]  omeprazole  (PRILOSEC) 20 MG capsule Take 20 mg by mouth daily.    [provider]  pantoprazole  (PROTONIX ) 40 MG tablet Take 40 mg by mouth every morning.    [provider]  SYMBICORT 80-4.5 MCG/ACT inhaler Inhale 2 puffs into the lungs every 4 (four) hours as needed. 01/31/22   [provider]  topiramate  (TOPAMAX ) 25 MG tablet topiramate  25mg  at bedtime for one week, then increase to 50mg  at bedtime.  Contact us  in 4-5 weeks for refill and update. 06/04/22   Skeet Juliene SAUNDERS, DO  triamcinolone cream (KENALOG) 0.1 % Apply 1 Application topically 2 (two) times daily. 10/17/24   [provider]    Allergies: Patient has no known allergies.    Review of Systems Negative except as per HPI Updated Vital Signs BP (!) 156/102 (BP Location: Left Arm)   Pulse 62   Temp 97.6 F (36.4 C) (Oral)   Resp 16   Ht 5' 8 (1.727 m)   Wt 104.3 kg   SpO2 99%   BMI 34.97 kg/m   Physical Exam Vitals and nursing note reviewed.  Constitutional:  General: He is not in acute distress.    Appearance: He is well-developed. He is not diaphoretic.  HENT:     Head: Normocephalic and atraumatic.     Mouth/Throat:     Mouth: Mucous membranes are moist.  Eyes:     Extraocular Movements: Extraocular movements intact.     Pupils: Pupils are equal, round, and reactive to light.  Cardiovascular:     Rate and Rhythm: Normal rate and regular rhythm.     Heart sounds: Normal heart sounds.  Pulmonary:     Effort: Pulmonary effort is normal.     Breath sounds: Normal breath  sounds.  Musculoskeletal:     Cervical back: Neck supple.     Right lower leg: Edema present.     Left lower leg: Edema present.     Comments: Trace edema bilateral lower extremities  Skin:    General: Skin is warm and dry.     Findings: No erythema or rash.  Neurological:     Mental Status: He is alert and oriented to person, place, and time.     Cranial Nerves: No cranial nerve deficit.     Sensory: No sensory deficit.     Motor: No weakness.     Gait: Gait normal.  Psychiatric:        Behavior: Behavior normal.     (all labs ordered are listed, but only abnormal results are displayed) Labs Reviewed  COMPREHENSIVE METABOLIC PANEL WITH GFR - Abnormal; Notable for the following components:      Result Value   Total Protein 8.2 (*)    Alkaline Phosphatase 130 (*)    All other components within normal limits  I-STAT CHEM 8, ED - Abnormal; Notable for the following components:   Potassium 3.3 (*)    Calcium, Ion 1.11 (*)    All other components within normal limits  PROTIME-INR  APTT  CBC  DIFFERENTIAL  ETHANOL    EKG: None  Radiology: CT HEAD WO CONTRAST Result Date: 10/19/2024 CLINICAL DATA:  Headache EXAM: CT HEAD WITHOUT CONTRAST TECHNIQUE: Contiguous axial images were obtained from the base of the skull through the vertex without intravenous contrast. RADIATION DOSE REDUCTION: This exam was performed according to the departmental dose-optimization program which includes automated exposure control, adjustment of the mA and/or kV according to patient size and/or use of iterative reconstruction technique. COMPARISON:  MRI 05/29/2022, CT brain 10/07/2021 FINDINGS: Brain: No acute territorial infarction, hemorrhage or intracranial mass. Mild white matter disease. Ventricles are nonenlarged Vascular: No hyperdense vessels.  No unexpected calcification Skull: Normal. Negative for fracture or focal lesion. Sinuses/Orbits: No acute finding. Other: None IMPRESSION: 1. No CT  evidence for acute intracranial abnormality. 2. Mild white matter disease, likely chronic small vessel ischemic change. Electronically Signed   By: Luke Bun M.D.   On: 10/19/2024 21:49     Procedures   Medications Ordered in the ED  sodium chloride  flush (NS) 0.9 % injection 3 mL (3 mLs Intravenous Not Given 10/20/24 0543)  magnesium  sulfate IVPB 2 g 50 mL (2 g Intravenous New Bag/Given 10/20/24 0449)  dexamethasone  (DECADRON ) injection 10 mg (10 mg Intravenous Given 10/20/24 0449)  ketorolac  (TORADOL ) 15 MG/ML injection 15 mg (15 mg Intravenous Given 10/20/24 0449)                                    Medical Decision Making Amount and/or Complexity of  Data Reviewed Labs: ordered. Radiology: ordered.   This patient presents to the ED for concern of headache, elevated blood pressure, this involves an extensive number of treatment options, and is a complaint that carries with it a high risk of complications and morbidity.  The differential diagnosis includes but not limited to atypical migraine, hypertensive emergency   Co morbidities / Chronic conditions that complicate the patient evaluation  Hypertension GERD, asthma, hyperlipidemia   Additional history obtained:  Additional history obtained from EMR External records from outside source obtained and reviewed including prior labs and imaging on file.  Visit to urgent care earlier today.   Lab Tests:  I Ordered, and personally interpreted labs.  The pertinent results include: CMP with normal renal function, CBC normal.   Imaging Studies ordered:  I ordered imaging studies including CT head  I independently visualized and interpreted imaging which showed no acute process I agree with the radiologist interpretation   Problem List / ED Course / Critical interventions / Medication management  44 year old male presents with headache and elevated blood pressure.  Reports compliance with his blood pressure medications.   States blood pressures have been elevated for the past 3 weeks or so, worse for the past few days.  Reporting generalized head pain with pressure behind his eyes and intermittent blurry vision.  Blood pressures somewhat improved compared to arrival although somewhat elevated at 156/102 currently.  Blood pressure cuff was adjusted and size changed without change in blood pressure significantly.  Neuroexam is unremarkable.  Workup is normal including CT head without acute process, normal renal function.  No complaints of chest pain or shortness of breath, no results or symptoms to suggest endorgan damage.  Plan is to provide migraine cocktail.  Recommend follow-up with PCP for medication adjustment. I ordered medication including toradol , decadron , magnesium     Reevaluation of the patient after these medicines showed that the patient stable  I have reviewed the patients home medicines and have made adjustments as needed   Social Determinants of Health:  Has PCP   Test / Admission - Considered:  Stable for dc      Final diagnoses:  Nonintractable episodic headache, unspecified headache type  Secondary hypertension    ED Discharge Orders     None          Beverley Leita LABOR, PA-C 10/20/24 0551    Haze Lonni PARAS, MD 10/20/24 [phone]  "

## 2024-10-20 NOTE — Discharge Instructions (Signed)
 Monitor your blood pressure. Record results and take to your primary care for follow up. Return to the ER for worsening or concerning symptoms.
